# Patient Record
Sex: Male | Born: 1970 | Race: Black or African American | Hispanic: No | Marital: Married | Smoking: Current every day smoker
Health system: Southern US, Community
[De-identification: ages and names within clinical notes are randomized; demographics above are authoritative.]

## PROBLEM LIST (undated history)

## (undated) DIAGNOSIS — M549 Dorsalgia, unspecified: Secondary | ICD-10-CM

## (undated) DIAGNOSIS — I10 Essential (primary) hypertension: Secondary | ICD-10-CM

## (undated) DIAGNOSIS — T300 Burn of unspecified body region, unspecified degree: Secondary | ICD-10-CM

## (undated) HISTORY — PX: TOOTH EXTRACTION: SUR596

---

## 1998-04-27 ENCOUNTER — Emergency Department (HOSPITAL_COMMUNITY): Admission: EM | Admit: 1998-04-27 | Discharge: 1998-04-27 | Payer: Self-pay | Admitting: Emergency Medicine

## 1999-05-21 ENCOUNTER — Emergency Department (HOSPITAL_COMMUNITY): Admission: EM | Admit: 1999-05-21 | Discharge: 1999-05-21 | Payer: Self-pay | Admitting: Emergency Medicine

## 1999-09-16 ENCOUNTER — Emergency Department (HOSPITAL_COMMUNITY): Admission: EM | Admit: 1999-09-16 | Discharge: 1999-09-16 | Payer: Self-pay | Admitting: Emergency Medicine

## 2001-06-24 ENCOUNTER — Emergency Department (HOSPITAL_COMMUNITY): Admission: EM | Admit: 2001-06-24 | Discharge: 2001-06-24 | Payer: Self-pay | Admitting: Emergency Medicine

## 2001-06-24 ENCOUNTER — Encounter: Payer: Self-pay | Admitting: Emergency Medicine

## 2002-01-18 ENCOUNTER — Emergency Department (HOSPITAL_COMMUNITY): Admission: EM | Admit: 2002-01-18 | Discharge: 2002-01-18 | Payer: Self-pay | Admitting: Emergency Medicine

## 2002-11-25 ENCOUNTER — Emergency Department (HOSPITAL_COMMUNITY): Admission: EM | Admit: 2002-11-25 | Discharge: 2002-11-26 | Payer: Self-pay | Admitting: Emergency Medicine

## 2008-07-15 ENCOUNTER — Emergency Department (HOSPITAL_COMMUNITY): Admission: EM | Admit: 2008-07-15 | Discharge: 2008-07-15 | Payer: Self-pay | Admitting: Family Medicine

## 2008-12-24 ENCOUNTER — Emergency Department (HOSPITAL_COMMUNITY): Admission: EM | Admit: 2008-12-24 | Discharge: 2008-12-24 | Payer: Self-pay | Admitting: Family Medicine

## 2009-04-11 ENCOUNTER — Emergency Department (HOSPITAL_COMMUNITY): Admission: EM | Admit: 2009-04-11 | Discharge: 2009-04-11 | Payer: Self-pay | Admitting: Emergency Medicine

## 2009-05-20 ENCOUNTER — Emergency Department (HOSPITAL_COMMUNITY): Admission: EM | Admit: 2009-05-20 | Discharge: 2009-05-21 | Payer: Self-pay | Admitting: Emergency Medicine

## 2011-03-26 ENCOUNTER — Inpatient Hospital Stay (INDEPENDENT_AMBULATORY_CARE_PROVIDER_SITE_OTHER)
Admission: RE | Admit: 2011-03-26 | Discharge: 2011-03-26 | Disposition: A | Discharge status: Home / Self Care | Payer: Self-pay | Source: Ambulatory Visit | Attending: Family Medicine | Admitting: Family Medicine

## 2011-03-26 DIAGNOSIS — K089 Disorder of teeth and supporting structures, unspecified: Secondary | ICD-10-CM

## 2011-03-26 DIAGNOSIS — K029 Dental caries, unspecified: Secondary | ICD-10-CM

## 2011-03-26 LAB — GLUCOSE, CAPILLARY: Glucose-Capillary: 88 mg/dL (ref 70–99)

## 2011-08-31 ENCOUNTER — Encounter (HOSPITAL_COMMUNITY): Payer: Self-pay

## 2011-08-31 ENCOUNTER — Emergency Department (INDEPENDENT_AMBULATORY_CARE_PROVIDER_SITE_OTHER)
Admission: EM | Admit: 2011-08-31 | Discharge: 2011-08-31 | Disposition: A | Discharge status: Home / Self Care | Payer: Self-pay | Source: Home / Self Care | Attending: Emergency Medicine | Admitting: Emergency Medicine

## 2011-08-31 DIAGNOSIS — K047 Periapical abscess without sinus: Secondary | ICD-10-CM

## 2011-08-31 DIAGNOSIS — IMO0002 Reserved for concepts with insufficient information to code with codable children: Secondary | ICD-10-CM

## 2011-08-31 HISTORY — DX: Dorsalgia, unspecified: M54.9

## 2011-08-31 MED ORDER — HYDROCODONE-ACETAMINOPHEN 5-325 MG PO TABS: ORAL_TABLET | ORAL | Status: AC

## 2011-08-31 MED ORDER — KETOROLAC TROMETHAMINE 60 MG/2ML IM SOLN
60.0000 mg | Freq: Once | INTRAMUSCULAR | Status: AC
  Administered 2011-08-31: 60 mg via INTRAMUSCULAR

## 2011-08-31 MED ORDER — PENICILLIN V POTASSIUM 500 MG PO TABS: 500.0000 mg | ORAL_TABLET | Freq: Four times a day (QID) | ORAL | Status: AC

## 2011-08-31 MED ORDER — KETOROLAC TROMETHAMINE 60 MG/2ML IM SOLN
INTRAMUSCULAR | Status: AC
  Filled 2011-08-31: qty 2

## 2011-08-31 MED ORDER — CYCLOBENZAPRINE HCL 5 MG PO TABS: 5.0000 mg | ORAL_TABLET | Freq: Three times a day (TID) | ORAL | Status: AC | PRN

## 2011-08-31 MED ORDER — PREDNISONE 10 MG PO TABS: ORAL_TABLET | ORAL | Status: DC

## 2011-08-31 NOTE — ED Notes (Signed)
States he has hx of low back pain, states flared up approx 3 days ago- no injury or strenuous activity.  Also c/o toothache (rt lower) that is making rt ear and face hurt - states this has been off and on for a while.  Has not taken anything for the pain.

## 2011-08-31 NOTE — ED Provider Notes (Signed)
History     CSN: [id number]  Arrival date & time 08/31/11  1127   First MD Initiated Contact with Patient 08/31/11 1143      Chief Complaint  Patient presents with  . Back Pain  . Dental Pain    (Consider location/radiation/quality/duration/timing/severity/associated sxs/prior treatment) HPI Comments: Daniel Rivera is in today for lower back pain and toothache.  His back pain has been going on since 1996. He attributes this to lifting heavy weights. He was diagnosed with a spinal nerve root irritation. He saw an orthopedist about 6-7 years ago. An MRI was done and he was given epidural steroids but they did not do any good. Since then he's had daily lower back pain with radiation to both thighs and numbness and tingling in both thighs. It's gotten worse over the past 4 days. He denies any precipitating factors. He denies any muscle weakness, bladder, or bowel complaints. The pain is worse with bending and lifting and better with rest.  He also has had a three-day history of pain in his right lower second molar tooth. This is partially decayed. He has multiple decayed teeth but this is only one is bothering him. It's painful. There is no swelling or. No drainage. It hurts with cool air, radiates towards the ear, and is associated with some headache. He denies fever or chills. It hurts to chew on that side, he denies any trouble breathing or swallowing.  Patient is a 41 y.o. male presenting with back pain and tooth pain.  Back Pain  Associated symptoms include numbness. Pertinent negatives include no fever, no abdominal pain, no dysuria and no weakness.  Dental PainPrimary symptoms do not include fever, shortness of breath or sore throat.  Additional symptoms do not include: facial swelling and trouble swallowing.    Past Medical History  Diagnosis Date  . Back pain     History reviewed. No pertinent past surgical history.  History reviewed. No pertinent family history.  History    Substance Use Topics  . Smoking status: Current Everyday Smoker  . Smokeless tobacco: Not on file  . Alcohol Use: Yes      Review of Systems  Constitutional: Negative for fever, chills and unexpected weight change.  HENT: Positive for dental problem. Negative for sore throat, facial swelling, trouble swallowing, neck pain and voice change.   Respiratory: Negative for chest tightness and shortness of breath.   Gastrointestinal: Negative for abdominal pain.  Genitourinary: Negative for dysuria, urgency, frequency and difficulty urinating.  Musculoskeletal: Positive for back pain. Negative for myalgias, joint swelling, arthralgias and gait problem.  Neurological: Positive for numbness. Negative for weakness.    Allergies  Review of patient's allergies indicates no known allergies.  Home Medications   Current Outpatient Rx  Name Route Sig Dispense Refill  . CYCLOBENZAPRINE HCL 5 MG PO TABS Oral Take 1 tablet (5 mg total) by mouth 3 (three) times daily as needed for muscle spasms. 30 tablet 0  . HYDROCODONE-ACETAMINOPHEN 5-325 MG PO TABS  1 to 2 tabs every 4 to 6 hours as needed for pain. 20 tablet 0  . PENICILLIN V POTASSIUM 500 MG PO TABS Oral Take 1 tablet (500 mg total) by mouth 4 (four) times daily. 40 tablet 0  . PREDNISONE 10 MG PO TABS  Take 4 tabs daily for 4 days, 3 tabs daily for 4 days, 2 tabs daily for 4 days, then 1 tab daily for 4 days.  Take all tabs at one time with food  and preferably in the morning except for the first dose. 40 tablet 0    BP 152/94  Pulse 72  Temp(Src) 97.9 F (36.6 C) (Oral)  Resp 20  SpO2 99%  Physical Exam  Nursing note and vitals reviewed. Constitutional: He is oriented to person, place, and time. He appears well-developed and well-nourished. No distress.  HENT:  Head: Normocephalic and atraumatic. No trismus in the jaw.  Right Ear: External ear normal.  Left Ear: External ear normal.  Nose: Nose normal.  Mouth/Throat: Uvula is  midline and oropharynx is clear and moist. Mucous membranes are not pale and not dry. No oral lesions. Abnormal dentition. Dental abscesses and dental caries present. No uvula swelling. No oropharyngeal exudate, posterior oropharyngeal edema or posterior oropharyngeal erythema.       His right lower second molar tooth is partially decayed and painful to touch. There is no purulent drainage or summation or swelling of the surrounding gingiva. No swelling of the floor the mouth. No swelling externally, but the jaw is tender to touch.  Eyes: Conjunctivae and EOM are normal. Pupils are equal, round, and reactive to light.  Neck: Normal range of motion. Neck supple.  Cardiovascular: Normal rate, regular rhythm and normal heart sounds.   Pulmonary/Chest: Effort normal and breath sounds normal. No respiratory distress.  Abdominal: Soft. Bowel sounds are normal. He exhibits no distension, no abdominal bruit, no pulsatile midline mass and no mass. There is no tenderness. There is no rebound and no guarding.       No pulsatile midline abdominal mass or bruit.  Musculoskeletal: He exhibits tenderness. He exhibits no edema.       Lumbar back: He exhibits decreased range of motion, tenderness, bony tenderness and pain. He exhibits no swelling, no edema, no deformity, no spasm and normal pulse.       Exam of the back reveals midline tenderness to palpation. The back has a surprisingly good range of motion but with pain. Straight leg raising is positive bilaterally. DTRs are 1+ and symmetrical for both knees and ankles. Muscle strength and sensation are intact. Pedal pulses are full.  Lymphadenopathy:    He has no cervical adenopathy.  Neurological: He is alert and oriented to person, place, and time. He has normal reflexes. He displays no atrophy. No sensory deficit. He exhibits normal muscle tone. Coordination and gait normal.  Skin: Skin is warm and dry. No rash noted. He is not diaphoretic.    ED Course    Procedures (including critical care time)  Labs Reviewed - No data to display No results found.   1. Dental abscess   2. Degenerative disc disease       MDM          Roque Lias, MD 08/31/11 1454

## 2011-10-01 ENCOUNTER — Ambulatory Visit: Payer: Self-pay | Admitting: Family Medicine

## 2013-02-18 ENCOUNTER — Encounter (HOSPITAL_COMMUNITY): Payer: Self-pay

## 2013-02-18 ENCOUNTER — Emergency Department (HOSPITAL_COMMUNITY)
Admission: EM | Admit: 2013-02-18 | Discharge: 2013-02-18 | Disposition: A | Discharge status: Home / Self Care | Payer: Self-pay | Attending: Emergency Medicine | Admitting: Emergency Medicine

## 2013-02-18 DIAGNOSIS — S0180XA Unspecified open wound of other part of head, initial encounter: Secondary | ICD-10-CM | POA: Insufficient documentation

## 2013-02-18 DIAGNOSIS — Y929 Unspecified place or not applicable: Secondary | ICD-10-CM | POA: Insufficient documentation

## 2013-02-18 DIAGNOSIS — W260XXA Contact with knife, initial encounter: Secondary | ICD-10-CM | POA: Insufficient documentation

## 2013-02-18 DIAGNOSIS — F17200 Nicotine dependence, unspecified, uncomplicated: Secondary | ICD-10-CM | POA: Insufficient documentation

## 2013-02-18 DIAGNOSIS — S0181XA Laceration without foreign body of other part of head, initial encounter: Secondary | ICD-10-CM

## 2013-02-18 DIAGNOSIS — Y9389 Activity, other specified: Secondary | ICD-10-CM | POA: Insufficient documentation

## 2013-02-18 MED ORDER — TETANUS-DIPHTH-ACELL PERTUSSIS 5-2.5-18.5 LF-MCG/0.5 IM SUSP
0.5000 mL | Freq: Once | INTRAMUSCULAR | Status: DC
  Filled 2013-02-18: qty 0.5

## 2013-02-18 MED ORDER — HYDROCODONE-ACETAMINOPHEN 5-325 MG PO TABS
2.0000 | ORAL_TABLET | Freq: Once | ORAL | Status: DC
  Filled 2013-02-18: qty 2

## 2013-02-18 MED ORDER — HYDROCODONE-ACETAMINOPHEN 5-325 MG PO TABS: 1.0000 | ORAL_TABLET | ORAL | Status: DC | PRN

## 2013-02-18 NOTE — ED Notes (Signed)
MD at bedside. 

## 2013-02-18 NOTE — ED Notes (Signed)
Pt not in room Charge Candise Bowens made aware
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
## 2013-02-18 NOTE — ED Notes (Addendum)
Laceration to chin EDPA heather in room

## 2013-02-18 NOTE — ED Notes (Signed)
Pt not in room.   Pt did not receive pain med RX. Pt did not receive meds ordered by EDPA at 2000.  EDPA Heather made aware.  Went by room twice and searched traige area for this pt and family.  Pt aware of meds and discharge papers.

## 2013-02-19 NOTE — ED Provider Notes (Signed)
  Medical screening examination/treatment/procedure(s) were performed by non-physician practitioner and as supervising physician I was immediately available for consultation/collaboration.    Gerhard Munch, MD 02/19/13 [phone]

## 2013-02-20 ENCOUNTER — Emergency Department (HOSPITAL_COMMUNITY)
Admission: EM | Admit: 2013-02-20 | Discharge: 2013-02-20 | Disposition: A | Discharge status: Home / Self Care | Payer: Self-pay | Attending: Emergency Medicine | Admitting: Emergency Medicine

## 2013-02-20 ENCOUNTER — Encounter (HOSPITAL_COMMUNITY): Payer: Self-pay | Admitting: Emergency Medicine

## 2013-02-20 DIAGNOSIS — Z760 Encounter for issue of repeat prescription: Secondary | ICD-10-CM | POA: Insufficient documentation

## 2013-02-20 DIAGNOSIS — M25519 Pain in unspecified shoulder: Secondary | ICD-10-CM | POA: Insufficient documentation

## 2013-02-20 DIAGNOSIS — M25511 Pain in right shoulder: Secondary | ICD-10-CM

## 2013-02-20 DIAGNOSIS — F17200 Nicotine dependence, unspecified, uncomplicated: Secondary | ICD-10-CM | POA: Insufficient documentation

## 2013-02-20 DIAGNOSIS — R51 Headache: Secondary | ICD-10-CM | POA: Insufficient documentation

## 2013-02-20 DIAGNOSIS — G8911 Acute pain due to trauma: Secondary | ICD-10-CM | POA: Insufficient documentation

## 2013-02-20 MED ORDER — HYDROCODONE-ACETAMINOPHEN 5-325 MG PO TABS: 1.0000 | ORAL_TABLET | ORAL | Status: DC | PRN

## 2013-02-20 MED ORDER — HYDROCODONE-ACETAMINOPHEN 5-325 MG PO TABS
1.0000 | ORAL_TABLET | Freq: Once | ORAL | Status: AC
  Administered 2013-02-20: 1 via ORAL
  Filled 2013-02-20: qty 1
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
## 2013-02-20 NOTE — Progress Notes (Signed)
[facility] has seen patient and provided him with a list of primary care resources. °
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
## 2013-02-20 NOTE — ED Notes (Signed)
PA at bedside.

## 2013-02-21 NOTE — ED Provider Notes (Signed)
Medical screening examination/treatment/procedure(s) were performed by non-physician practitioner and as supervising physician I was immediately available for consultation/collaboration.  Candyce Churn, MD 02/21/13 [phone]

## 2013-06-14 ENCOUNTER — Encounter (HOSPITAL_COMMUNITY): Payer: Self-pay | Admitting: Emergency Medicine

## 2013-06-14 ENCOUNTER — Emergency Department (HOSPITAL_COMMUNITY)
Admission: EM | Admit: 2013-06-14 | Discharge: 2013-06-14 | Disposition: A | Discharge status: Home / Self Care | Payer: Self-pay | Attending: Emergency Medicine | Admitting: Emergency Medicine

## 2013-06-14 DIAGNOSIS — F17200 Nicotine dependence, unspecified, uncomplicated: Secondary | ICD-10-CM | POA: Insufficient documentation

## 2013-06-14 DIAGNOSIS — K029 Dental caries, unspecified: Secondary | ICD-10-CM | POA: Insufficient documentation

## 2013-06-14 DIAGNOSIS — K047 Periapical abscess without sinus: Secondary | ICD-10-CM | POA: Insufficient documentation

## 2013-06-14 MED ORDER — PENICILLIN V POTASSIUM 250 MG PO TABS: 250.0000 mg | ORAL_TABLET | Freq: Four times a day (QID) | ORAL | Status: AC

## 2013-06-14 MED ORDER — HYDROCODONE-ACETAMINOPHEN 5-325 MG PO TABS: 1.0000 | ORAL_TABLET | Freq: Four times a day (QID) | ORAL | Status: DC | PRN

## 2013-06-14 NOTE — ED Provider Notes (Addendum)
CSN: [id number]     Arrival date & time 06/14/13  7829 History   First MD Initiated Contact with Patient 06/14/13 0749     Chief Complaint  Patient presents with  . Dental Pain   (Consider location/radiation/quality/duration/timing/severity/associated sxs/prior Treatment) Patient is a 42 y.o. male presenting with tooth pain. The history is provided by the patient.  Dental Pain Location:  Lower Lower teeth location:  19/LL 1st molar Quality:  Localized, sharp, pulsating and throbbing Severity:  Severe Onset quality:  Gradual Duration:  4 days Timing:  Constant Progression:  Worsening Chronicity:  New Context: abscess and dental caries   Context: not trauma   Previous work-up:  Filled cavity Relieved by:  None tried Worsened by:  Cold food/drink, hot food/drink, jaw movement and touching Ineffective treatments:  None tried Associated symptoms: facial pain and facial swelling   Associated symptoms: no difficulty swallowing, no drooling, no neck swelling and no trismus   Risk factors: alcohol problem, lack of dental care, periodontal disease and smoking     Past Medical History  Diagnosis Date  . Back pain    No past surgical history on file. No family history on file. History  Substance Use Topics  . Smoking status: Current Every Day Smoker  . Smokeless tobacco: Not on file  . Alcohol Use: Yes    Review of Systems  HENT: Positive for facial swelling. Negative for drooling.   All other systems reviewed and are negative.    Allergies  Review of patient's allergies indicates no known allergies.  Home Medications   Current Outpatient Rx  Name  Route  Sig  Dispense  Refill  . HYDROcodone-acetaminophen (NORCO/VICODIN) 5-325 MG per tablet   Oral   Take 1 tablet by mouth every 4 (four) hours as needed for pain.   6 tablet   0    BP 123/91  Pulse 93  Temp(Src) 98.8 F (37.1 C) (Oral)  Resp 20  SpO2 97% Physical Exam  Nursing note and vitals  reviewed. Constitutional: He is oriented to person, place, and time. He appears well-developed and well-nourished. No distress.  HENT:  Head: Normocephalic and atraumatic.  Mouth/Throat: No trismus in the jaw. Dental abscesses and dental caries present.    Mild looseness of the tooth.  No subungal swelling or tongue swelling  Eyes: EOM are normal. Pupils are equal, round, and reactive to light.  Cardiovascular: Normal rate.   Pulmonary/Chest: Effort normal.  Musculoskeletal: He exhibits no edema and no tenderness.  Neurological: He is alert and oriented to person, place, and time.  Skin: Skin is warm and dry. No rash noted. No erythema.  Psychiatric: He has a normal mood and affect. His behavior is normal.    ED Course  Procedures (including critical care time) Labs Review Labs Reviewed - No data to display Imaging Review No results found.  EKG Interpretation   None       MDM   1. Dental abscess     Pt with dental caries and facial swelling.  No signs of ludwig's angina or difficulty swallowing and no systemic symptoms. Will treat with PCN and have pt f/u with dentist.    Gwyneth Sprout, MD 06/14/13 0800  Gwyneth Sprout, MD 06/14/13 [phone]

## 2013-06-14 NOTE — ED Notes (Signed)
Patient states three days ago tooth pain started. Patient states the tooth is a "Bad tooth." Patient states, "Tried to take pain as long as he could."

## 2013-06-14 NOTE — ED Notes (Signed)
MD at bedside. 

## 2013-07-31 ENCOUNTER — Emergency Department (HOSPITAL_COMMUNITY)
Admission: EM | Admit: 2013-07-31 | Discharge: 2013-07-31 | Disposition: A | Discharge status: Home / Self Care | Payer: Self-pay | Attending: Emergency Medicine | Admitting: Emergency Medicine

## 2013-07-31 DIAGNOSIS — F17200 Nicotine dependence, unspecified, uncomplicated: Secondary | ICD-10-CM | POA: Insufficient documentation

## 2013-07-31 DIAGNOSIS — K089 Disorder of teeth and supporting structures, unspecified: Secondary | ICD-10-CM | POA: Insufficient documentation

## 2013-07-31 DIAGNOSIS — K002 Abnormalities of size and form of teeth: Secondary | ICD-10-CM | POA: Insufficient documentation

## 2013-07-31 DIAGNOSIS — Z8739 Personal history of other diseases of the musculoskeletal system and connective tissue: Secondary | ICD-10-CM | POA: Insufficient documentation

## 2013-07-31 DIAGNOSIS — K047 Periapical abscess without sinus: Secondary | ICD-10-CM | POA: Insufficient documentation

## 2013-07-31 DIAGNOSIS — K029 Dental caries, unspecified: Secondary | ICD-10-CM | POA: Insufficient documentation

## 2013-07-31 MED ORDER — PENICILLIN V POTASSIUM 500 MG PO TABS: 500.0000 mg | ORAL_TABLET | Freq: Four times a day (QID) | ORAL | Status: DC

## 2013-07-31 MED ORDER — TRAMADOL HCL 50 MG PO TABS
50.0000 mg | ORAL_TABLET | Freq: Once | ORAL | Status: AC
  Administered 2013-07-31: 50 mg via ORAL
  Filled 2013-07-31: qty 1

## 2013-07-31 MED ORDER — PENICILLIN V POTASSIUM 500 MG PO TABS
500.0000 mg | ORAL_TABLET | Freq: Once | ORAL | Status: AC
  Administered 2013-07-31: 500 mg via ORAL
  Filled 2013-07-31: qty 1

## 2013-07-31 MED ORDER — TRAMADOL HCL 50 MG PO TABS: 50.0000 mg | ORAL_TABLET | Freq: Four times a day (QID) | ORAL | Status: DC | PRN

## 2013-07-31 NOTE — ED Notes (Signed)
Pt c/o dental abscess to L side of mouth for 2 days. Pt states he does not have a dentist. Pt with no acute distress.

## 2013-07-31 NOTE — ED Provider Notes (Signed)
CSN: [id number]     Arrival date & time 07/31/13  2036 History  This chart was scribed for Earley Favor, NP, working with Shanna Cisco, MD by Blanchard Kelch, ED Scribe. This patient was seen in room WTR9/WTR9 and the patient's care was started at 9:46 PM.     Chief Complaint  Patient presents with  . Dental abscess    Patient is a 42 y.o. male presenting with tooth pain. The history is provided by the patient. No language interpreter was used.  Dental Pain Location:  Generalized and lower Lower teeth location:  19/LL 1st molar, 18/LL 2nd molar and 17/LL 3rd molar Quality:  Aching Severity:  Moderate Onset quality:  Gradual Duration:  2 days Timing:  Constant Progression:  Worsening Chronicity:  Recurrent Context: abscess, dental caries and poor dentition   Relieved by:  Nothing Worsened by:  Nothing tried Associated symptoms: facial swelling and gum swelling     HPI Comments: Leonides Minder is a 42 y.o. male who presents to the Emergency Department complaining of constant left sided dental pain that began two days ago. He has associated swelling on the left side. He has not been seen by a dentist for the pain.  He goes to [facility] walk in dental clinic.  Past Medical History  Diagnosis Date  . Back pain    No past surgical history on file. No family history on file. History  Substance Use Topics  . Smoking status: Current Every Day Smoker -- 1.50 packs/day  . Smokeless tobacco: Not on file  . Alcohol Use: No    Review of Systems  HENT: Positive for dental problem and facial swelling.   All other systems reviewed and are negative.    Allergies  Review of patient's allergies indicates no known allergies.  Home Medications   Current Outpatient Rx  Name  Route  Sig  Dispense  Refill  . penicillin v potassium (VEETID) 500 MG tablet   Oral   Take 1 tablet (500 mg total) by mouth 4 (four) times daily.   39 tablet   0   . traMADol (ULTRAM) 50 MG tablet  Oral   Take 1 tablet (50 mg total) by mouth every 6 (six) hours as needed.   30 tablet   0    Triage Vitals: BP 144/96  Pulse 95  Temp(Src) 98.6 F (37 C) (Oral)  Resp 18  SpO2 100%  Physical Exam  Nursing note and vitals reviewed. Constitutional: He is oriented to person, place, and time. He appears well-developed and well-nourished. No distress.  HENT:  Head: Normocephalic and atraumatic.  Right Ear: External ear normal.  Left Ear: External ear normal.  Mouth/Throat: Abnormal dentition.  Pyria in front six teeth. Missing tooth and then molars. Gums inflamed from 2nd 3rd and 4th molar on left side. Submandibular adenopathy  Eyes: EOM are normal. Pupils are equal, round, and reactive to light.  Neck: Neck supple. No tracheal deviation present.  Cardiovascular: Normal rate.   Pulmonary/Chest: Effort normal. No respiratory distress.  Musculoskeletal: Normal range of motion.  Lymphadenopathy:       Head (right side): Submandibular adenopathy present.       Head (left side): Submandibular adenopathy present.  Submandibular adenopathy.  Neurological: He is alert and oriented to person, place, and time.  Skin: Skin is warm and dry.  Psychiatric: He has a normal mood and affect. His behavior is normal.    ED Course  Procedures (including critical care time)  DIAGNOSTIC STUDIES: Oxygen Saturation is 100% on room air, normal by my interpretation.    COORDINATION OF CARE: 9:51 PM -Will provide materials to patient for dental care. Patient verbalizes understanding and agrees with treatment plan.     Labs Review Labs Reviewed - No data to display Imaging Review No results found.  EKG Interpretation   None       MDM   1. Dental abscess      I personally performed the services described in this documentation, which was scribed in my presence. The recorded information has been reviewed and is accurate.      Arman Filter, NP 07/31/13 2220

## 2013-08-01 NOTE — ED Provider Notes (Signed)
Medical screening examination/treatment/procedure(s) were performed by non-physician practitioner and as supervising physician I was immediately available for consultation/collaboration.  Shanna Cisco, MD 08/01/13 1106

## 2014-01-01 ENCOUNTER — Emergency Department (HOSPITAL_COMMUNITY)
Admission: EM | Admit: 2014-01-01 | Discharge: 2014-01-02 | Discharge status: Against Medical Advice | Payer: Self-pay | Attending: Emergency Medicine | Admitting: Emergency Medicine

## 2014-01-01 ENCOUNTER — Encounter (HOSPITAL_COMMUNITY): Payer: Self-pay | Admitting: Emergency Medicine

## 2014-01-01 DIAGNOSIS — F17200 Nicotine dependence, unspecified, uncomplicated: Secondary | ICD-10-CM | POA: Insufficient documentation

## 2014-01-01 DIAGNOSIS — K089 Disorder of teeth and supporting structures, unspecified: Secondary | ICD-10-CM | POA: Insufficient documentation

## 2014-01-01 NOTE — ED Notes (Signed)
Pt presents with c/o dental pain. Pt says that his front tooth is broken and loose and that it is very painful. Also c/o abscess to the left lower side of his mouth.

## 2014-01-02 NOTE — ED Notes (Signed)
Patient left after being triaged.  No staff notified

## 2014-01-14 ENCOUNTER — Encounter (HOSPITAL_COMMUNITY): Payer: Self-pay | Admitting: Emergency Medicine

## 2014-01-14 ENCOUNTER — Emergency Department (HOSPITAL_COMMUNITY)
Admission: EM | Admit: 2014-01-14 | Discharge: 2014-01-14 | Disposition: A | Discharge status: Home / Self Care | Payer: Self-pay | Attending: Emergency Medicine | Admitting: Emergency Medicine

## 2014-01-14 DIAGNOSIS — F17200 Nicotine dependence, unspecified, uncomplicated: Secondary | ICD-10-CM | POA: Insufficient documentation

## 2014-01-14 DIAGNOSIS — K047 Periapical abscess without sinus: Secondary | ICD-10-CM

## 2014-01-14 DIAGNOSIS — K029 Dental caries, unspecified: Secondary | ICD-10-CM | POA: Insufficient documentation

## 2014-01-14 DIAGNOSIS — K044 Acute apical periodontitis of pulpal origin: Secondary | ICD-10-CM | POA: Insufficient documentation

## 2014-01-14 MED ORDER — HYDROCODONE-ACETAMINOPHEN 5-325 MG PO TABS: 1.0000 | ORAL_TABLET | ORAL | Status: DC | PRN

## 2014-01-14 MED ORDER — PENICILLIN V POTASSIUM 500 MG PO TABS
500.0000 mg | ORAL_TABLET | Freq: Four times a day (QID) | ORAL | Status: AC
Start: 2014-01-14 — End: 2014-01-21

## 2014-01-14 NOTE — ED Provider Notes (Signed)
CSN: [id number]     Arrival date & time 01/14/14  0740 History   First MD Initiated Contact with Patient 01/14/14 0750     Chief Complaint  Patient presents with  . Dental Pain     (Consider location/radiation/quality/duration/timing/severity/associated sxs/prior Treatment) Patient is a 43 y.o. male presenting with tooth pain. The history is provided by the patient.  Dental Pain  He complains of swelling and pain, left lower jaw, 3 days, he does not have a dentist. He's had recurrent problems like this in the past. He denies fever, chills, nausea, vomiting, weakness, or dizziness. History over-the-counter analgesia, without relief of his discomfort. There are no other known modifying factors  Past Medical History  Diagnosis Date  . Back pain    History reviewed. No pertinent past surgical history. No family history on file. History  Substance Use Topics  . Smoking status: Current Every Day Smoker -- 1.50 packs/day  . Smokeless tobacco: Not on file  . Alcohol Use: No    Review of Systems  All other systems reviewed and are negative.     Allergies  Review of patient's allergies indicates no known allergies.  Home Medications   Prior to Admission medications   Medication Sig Start Date End Date Taking? Authorizing Provider  HYDROcodone-acetaminophen (NORCO) 5-325 MG per tablet Take 1 tablet by mouth every 4 (four) hours as needed. 01/14/14   Flint MelterElliott L Romana Deaton, MD  penicillin v potassium (VEETID) 500 MG tablet Take 1 tablet (500 mg total) by mouth 4 (four) times daily. 01/14/14 01/21/14  Flint MelterElliott L Sarah Baez, MD   BP 150/86  Pulse 83  Temp(Src) 98.4 F (36.9 C) (Oral)  Resp 16  SpO2 100% Physical Exam  Nursing note and vitals reviewed. Constitutional: He is oriented to person, place, and time. He appears well-developed and well-nourished.  HENT:  Head: Normocephalic and atraumatic.  Right Ear: External ear normal.  Left Ear: External ear normal.  Poor dentition with multiple  caries, and fractured teeth. Induration left lower gutter, adjacent to lateral incisors, without frank abscess or drainage. There is no trismus. There is mild, associated swelling beneath the left mandible. No adenopathy of anterior cervical chain.  Eyes: Conjunctivae and EOM are normal. Pupils are equal, round, and reactive to light.  Neck: Normal range of motion and phonation normal. Neck supple.  Cardiovascular: Normal rate, regular rhythm, normal heart sounds and intact distal pulses.   Pulmonary/Chest: Effort normal and breath sounds normal. He exhibits no bony tenderness.  Abdominal: Soft. There is no tenderness.  Musculoskeletal: Normal range of motion.  Neurological: He is alert and oriented to person, place, and time. No cranial nerve deficit or sensory deficit. He exhibits normal muscle tone. Coordination normal.  Skin: Skin is warm, dry and intact.  Psychiatric: He has a normal mood and affect. His behavior is normal. Judgment and thought content normal.    ED Course  Procedures (including critical care time) Labs Review Labs Reviewed - No data to display  Imaging Review No results found.   EKG Interpretation None      MDM   Final diagnoses:  Dental infection    Dental infection, without abscess. Poor dentition. He needs extensive dental repair. He is referred to the on-call dentist.  Nursing Notes Reviewed/ Care Coordinated Applicable Imaging Reviewed Interpretation of Laboratory Data incorporated into ED treatment  The patient appears reasonably screened and/or stabilized for discharge and I doubt any other medical condition or other [facility] requiring further screening, evaluation, or treatment  in the ED at this time prior to discharge.  Plan: Home Medications- Norco, PCN; Home Treatments- rest, Heat; return here if the recommended treatment, does not improve the symptoms; Recommended follow up- On-call Dentist, call today    Flint Melter, MD 01/14/14 [phone]

## 2014-01-14 NOTE — ED Notes (Signed)
Pt c/o left lower dental pain x 1 week. Pt states he has an abscess there as well.

## 2014-01-14 NOTE — Discharge Instructions (Signed)
Facial Infection You have an infection of your face. This requires special attention to help prevent serious problems. Infections in facial wounds can cause poor healing and scars. They can also spread to deeper tissues, especially around the eye. Wound and dental infections can lead to sinusitis, infection of the eye socket, and even meningitis. Permanent damage to the skin, eye, and nervous system may result if facial infections are not treated properly. With severe infections, hospital care for IV antibiotic injections may be needed if they don't respond to oral antibiotics. Antibiotics must be taken for the full course to insure the infection is eliminated. If the infection came from a bad tooth, it may have to be extracted when the infection is under control. Warm compresses may be applied to reduce skin irritation and remove drainage. You might need a tetanus shot now if:  You cannot remember when your last tetanus shot was.  You have never had a tetanus shot.  The object that caused your wound was dirty. If you need a tetanus shot, and you decide not to get one, there is a rare chance of getting tetanus. Sickness from tetanus can be serious. If you got a tetanus shot, your arm may swell, get red and warm to the touch at the shot site. This is common and not a problem. SEEK IMMEDIATE MEDICAL CARE IF:   You have increased swelling, redness, or trouble breathing.  You have a severe headache, dizziness, nausea, or vomiting.  You develop problems with your eyesight.  You have a fever. Document Released: 09/02/2004 Document Revised: 10/18/2011 Document Reviewed: 07/26/2005 [facility] Patient Information 2014 [place].  Dental Care and Dentist Visits Dental care supports good overall health. Regular dental visits can also help you avoid dental pain, bleeding, infection, and other more serious health problems in the future. It is important to keep the mouth healthy because diseases in the  teeth, gums, and other oral tissues can spread to other areas of the body. Some problems, such as diabetes, heart disease, and pre-term labor have been associated with poor oral health.  See your dentist every 6 months. If you experience emergency problems such as a toothache or broken tooth, go to the dentist right away. If you see your dentist regularly, you may catch problems early. It is easier to be treated for problems in the early stages.  WHAT TO EXPECT AT A DENTIST VISIT  Your dentist will look for many common oral health problems and recommend proper treatment. At your regular dental visit, you can expect:  Gentle cleaning of the teeth and gums. This includes scraping and polishing. This helps to remove the sticky substance around the teeth and gums (plaque). Plaque forms in the mouth shortly after eating. Over time, plaque hardens on the teeth as tartar. If tartar is not removed regularly, it can cause problems. Cleaning also helps remove stains.  Periodic X-rays. These pictures of the teeth and supporting bone will help your dentist assess the health of your teeth.  Periodic fluoride treatments. Fluoride is a natural mineral shown to help strengthen teeth. Fluoride treatmentinvolves applying a fluoride gel or varnish to the teeth. It is most commonly done in children.  Examination of the mouth, tongue, jaws, teeth, and gums to look for any oral health problems, such as:  Cavities (dental caries). This is decay on the tooth caused by plaque, sugar, and acid in the mouth. It is best to catch a cavity when it is small.  Inflammation of the  gums caused by plaque buildup (gingivitis).  Problems with the mouth or malformed or misaligned teeth.  Oral cancer or other diseases of the soft tissues or jaws. KEEP YOUR TEETH AND GUMS HEALTHY For healthy teeth and gums, follow these general guidelines as well as your dentist's specific advice:  Have your teeth professionally cleaned at the  dentist every 6 months.  Brush twice daily with a fluoride toothpaste.  Floss your teeth daily.  Ask your dentist if you need fluoride supplements, treatments, or fluoride toothpaste.  Eat a healthy diet. Reduce foods and drinks with added sugar.  Avoid smoking. TREATMENT FOR ORAL HEALTH PROBLEMS If you have oral health problems, treatment varies depending on the conditions present in your teeth and gums.  Your caregiver will most likely recommend good oral hygiene at each visit.  For cavities, gingivitis, or other oral health disease, your caregiver will perform a procedure to treat the problem. This is typically done at a separate appointment. Sometimes your caregiver will refer you to another dental specialist for specific tooth problems or for surgery. SEEK IMMEDIATE DENTAL CARE IF:  You have pain, bleeding, or soreness in the gum, tooth, jaw, or mouth area.  A permanent tooth becomes loose or separated from the gum socket.  You experience a blow or injury to the mouth or jaw area. Document Released: 04/07/2011 Document Revised: 10/18/2011 Document Reviewed: 04/07/2011 [facility] Patient Information 2014 [place], [place].

## 2014-01-24 ENCOUNTER — Encounter (HOSPITAL_COMMUNITY): Payer: Self-pay | Admitting: Emergency Medicine

## 2014-01-24 ENCOUNTER — Emergency Department (HOSPITAL_COMMUNITY)
Admission: EM | Admit: 2014-01-24 | Discharge: 2014-01-25 | Disposition: A | Discharge status: Home / Self Care | Payer: Self-pay | Attending: Emergency Medicine | Admitting: Emergency Medicine

## 2014-01-24 DIAGNOSIS — K047 Periapical abscess without sinus: Secondary | ICD-10-CM | POA: Insufficient documentation

## 2014-01-24 DIAGNOSIS — Z792 Long term (current) use of antibiotics: Secondary | ICD-10-CM | POA: Insufficient documentation

## 2014-01-24 DIAGNOSIS — Z9889 Other specified postprocedural states: Secondary | ICD-10-CM | POA: Insufficient documentation

## 2014-01-24 DIAGNOSIS — K089 Disorder of teeth and supporting structures, unspecified: Secondary | ICD-10-CM | POA: Insufficient documentation

## 2014-01-24 DIAGNOSIS — F17200 Nicotine dependence, unspecified, uncomplicated: Secondary | ICD-10-CM | POA: Insufficient documentation

## 2014-01-24 DIAGNOSIS — K029 Dental caries, unspecified: Secondary | ICD-10-CM | POA: Insufficient documentation

## 2014-01-24 DIAGNOSIS — K0889 Other specified disorders of teeth and supporting structures: Secondary | ICD-10-CM

## 2014-01-24 MED ORDER — AMOXICILLIN 500 MG PO CAPS: 500.0000 mg | ORAL_CAPSULE | Freq: Three times a day (TID) | ORAL | Status: DC

## 2014-01-24 MED ORDER — OXYCODONE-ACETAMINOPHEN 5-325 MG PO TABS: 1.0000 | ORAL_TABLET | ORAL | Status: DC | PRN

## 2014-01-24 MED ORDER — HYDROMORPHONE HCL PF 2 MG/ML IJ SOLN
2.0000 mg | Freq: Once | INTRAMUSCULAR | Status: AC
  Administered 2014-01-24: 2 mg via INTRAMUSCULAR
  Filled 2014-01-24: qty 1

## 2014-01-24 MED ORDER — AMOXICILLIN 500 MG PO CAPS
1000.0000 mg | ORAL_CAPSULE | Freq: Three times a day (TID) | ORAL | Status: DC
  Administered 2014-01-24: 1000 mg via ORAL
  Filled 2014-01-24: qty 2

## 2014-01-24 NOTE — ED Provider Notes (Signed)
CSN: [id number]     Arrival date & time 01/24/14  2102 History   First MD Initiated Contact with Patient 01/24/14 2241     Chief Complaint  Patient presents with  . Oral Swelling     (Consider location/radiation/quality/duration/timing/severity/associated sxs/prior Treatment) Patient is a 43 y.o. male presenting with tooth pain.  Dental Pain Location:  Lower Lower teeth location:  18/LL 2nd molar and 19/LL 1st molar Quality:  Pressure-like, pulsating, radiating, constant and throbbing Severity:  Severe Onset quality:  Gradual Duration:  2 days Timing:  Constant Progression:  Worsening Chronicity:  Recurrent Context: dental caries   Relieved by:  None tried Worsened by:  Cold food/drink, touching, jaw movement, pressure and hot food/drink Ineffective treatments:  None tried Associated symptoms: facial swelling and gum swelling   Associated symptoms: no congestion, no difficulty swallowing, no drooling, no fever, no headaches, no neck pain, no neck swelling, no oral bleeding, no oral lesions and no trismus   Risk factors: periodontal disease and smoking      Past Medical History  Diagnosis Date  . Back pain    Past Surgical History  Procedure Laterality Date  . Tooth extraction     History reviewed. No pertinent family history. History  Substance Use Topics  . Smoking status: Current Every Day Smoker -- 1.50 packs/day  . Smokeless tobacco: Not on file  . Alcohol Use: Yes    Review of Systems  Constitutional: Negative for fever and chills.  HENT: Positive for facial swelling. Negative for congestion, drooling, mouth sores, trouble swallowing and voice change.   Gastrointestinal: Negative for nausea and vomiting.  Musculoskeletal: Negative for myalgias, neck pain and neck stiffness.  Skin: Negative for wound.  Neurological: Negative for headaches.      Allergies  Review of patient's allergies indicates no known allergies.  Home Medications   Prior to  Admission medications   Medication Sig Start Date End Date Taking? Authorizing Lavonda Thal  penicillin v potassium (VEETID) 500 MG tablet Take 500 mg by mouth 4 (four) times daily.   Yes Historical Bellina Tokarczyk, MD  HYDROcodone-acetaminophen (NORCO/VICODIN) 5-325 MG per tablet Take 1 tablet by mouth every 4 (four) hours as needed for moderate pain.    Historical Kylinn Shropshire, MD   BP 149/92  Pulse 84  Temp(Src) 98.7 F (37.1 C) (Oral)  Resp 18  SpO2 97% Physical Exam  Nursing note and vitals reviewed. Constitutional: He appears well-developed and well-nourished. No distress.  HENT:  Head: Normocephalic and atraumatic.  Mouth/Throat: Uvula is midline and oropharynx is clear and moist. No trismus in the jaw. Dental caries present. No uvula swelling.  Facial swelling present erythematous gums and induration present in the area of swelling without fluctuance. Foul smelling breath   Eyes: Conjunctivae are normal. No scleral icterus.  Neck: Normal range of motion. Neck supple.  Cardiovascular: Normal rate, regular rhythm and normal heart sounds.   Pulmonary/Chest: Effort normal and breath sounds normal. No respiratory distress.  Abdominal: Soft. There is no tenderness.  Musculoskeletal: He exhibits no edema.  Neurological: He is alert.  Skin: Skin is warm and dry. He is not diaphoretic.  Psychiatric: His behavior is normal.    ED Course  Procedures (including critical care time) Labs Review Labs Reviewed - No data to display  Imaging Review No results found.   EKG Interpretation None      MDM   Final diagnoses:  Pain, dental  Dental abscess    Patient with developing dental abscess.  He will  need pain meds and abx. Patient will follow up with his dentist.   Exam unconcerning for Ludwig's angina or spread of infection.  Will treat with [place] pain medicine.  Urged patient to follow-up with dentist.       Arthor CaptainAbigail Harris, PA-C 01/30/14 1057

## 2014-01-24 NOTE — ED Notes (Signed)
Patient is alert and oriented x3.  He is complaining of dental swelling that started 2 days ago. Patient states that he recently had 3 teeth pulled last week.  Currently he rates his pain 10 of 10.

## 2014-01-24 NOTE — Discharge Instructions (Signed)
You have been diagnosed with Dental pain. Please call the follow up dentist first thing in the morning on Monday for a follow up appointment. Keep your discharge paperwork from today's visit to bring to the dentist office. You may also use the resource guide listed below to help you find a dentist if you do not already have one to followup with. It is very important that you get evaluated by a dentist as soon as possible.  Use your pain medication as prescribed and do not operate heavy machinery while on pain medication. Note that your pain medication contains acetaminophen (Tylenol) & its is not reccommended that you use additional acetaminophen (Tylenol) while taking this medication. Take your full course of antibiotics. Read the instructions below. ° °Eat a soft or liquid diet and rinse your mouth out after meals with warm water. You should see a dentist or return here at once if you have increased swelling, increased pain or uncontrolled bleeding from the site of your injury. ° ° °SEEK MEDICAL CARE IF:  °· You have increased pain not controlled with medicines.  °· You have swelling around your tooth, in your face or neck.  °· You have bleeding which starts, continues, or gets worse.  °· You have a fever >101 °· If you are unable to open your mouth °Soft Diet  °The soft diet may be recommended after you were put on a full liquid diet. A normal diet may follow. The soft diet can also be used after surgery if you are too ill to keep down a normal diet. The soft diet may also be needed if you have a hard time chewing foods.  °DESCRIPTION  °Tender foods are used. Foods do not need to be ground or pureed. Most raw fruits and vegetables and coarse breads and cereals should be avoided. Fried foods and highly seasoned foods may cause discomfort.  °NUTRITIONAL ADEQUACY  °A healthy diet is possible if foods from each of the basic food groups are eaten daily.  °SOFT DIET FOOD LISTS  °Milk/Dairy  °Allowed: Milk and milk  drinks, milk shakes, cream cheese, cottage cheese, mild cheeses.  °Avoid: Sharp or highly seasoned cheese. °Meat/Meat Substitutes  °Allowed: Broiled, roasted, baked, or stewed tender lean beef, mutton, lamb, veal, chicken, turkey, liver, ham, crisp bacon, white fish, tuna, salmon. Eggs, smooth peanut butter.  °Avoid: All fried meats, fish, or fowl. Rich gravies and sauces. Lunch meats, sausages, hot dogs. Meats with gristle, chunky peanut butter. °Breads/Grains  °Allowed: Rice, noodles, spaghetti, macaroni. Dry or cooked refined cereals, such as farina, cream of wheat, oatmeal, grits, whole-wheat cereals. Plain or toasted white or wheat blend or whole-grain breads, soda crackers or saltines, flour tortillas.  °Avoid: Wild rice, coarse cereals, such as bran. Seed in or on breads and crackers. Bread or bread products with nuts or seeds. °Fruits/Vegetables  °Allowed: Fruit and vegetable juices, well-cooked or canned fruits and vegetables, any dried fruit. One citrus fruit daily, 1 vitamin A source daily. Well-ripened, easy to chew fruits, sweet potatoes. Baked, boiled, mashed, creamed, scalloped, or au gratin potatoes. Broths or creamed soups made with allowed vegetables, strained tomatoes.  °Avoid: All gas-forming vegetables (corn, radishes, Brussels sprouts, onions, broccoli, cabbage, parsnips, turnips, chili peppers, pinto beans, split peas, dried beans). Fruits containing seeds and skin. Potato chips and corn chips. All others that are not made with allowed vegetables. Highly seasoned soups. °Desserts/Sweets  °Allowed: Simple desserts, such as custard, junkets, gelatin desserts, plain ice cream and sherbets, simple cakes   and cookies, allowed fruits, sugar, syrup, jelly, honey, plain hard candy, and molasses.  Avoid: Rich pastries, any dessert containing dates, nuts, raisins, or coconut. Fried pastries, such as doughnuts. Chocolate. Beverages  Allowed: Fruit and vegetable juices. Caffeine-free carbonated drinks,  coffee, and tea.  Avoid: Caffeinated beverages: coffee, tea, soda or pop. Miscellaneous  Allowed: Butter, cream, margarine, mayonnaise, oil. Cream sauces, salt, and mild spices.  Avoid: Highly spiced salad dressings. Highly seasoned foods, hot sauce, mustard, horseradish, and pepper. SAMPLE MENU  Breakfast  Orange juice.  Oatmeal.  Soft cooked egg.  Toast and margarine.  2% milk.  Coffee. Lunch  Meatloaf.  Mashed potato.  Green beans.  Lemon pudding.  Bread and margarine.  Coffee. Dinner  Consomm or apricot nectar.  Chicken breast.  Rice, peas, and carrots.  Applesauce.  Bread and margarine.  2% milk. To cut the amount of fat in your diet, omit margarine and use 1% or skim milk.  NUTRIENT ANALYSIS  Calories........................1953 Kcal.  Protein.........................102 gm.  Carbohydrate...............247 gm.  Fat................................65 gm.  Cholesterol...................449 mg.  Dietary fiber.................19 gm.  Vitamin A.....................2944 RE.  Vitamin C.....................79 mg.  Niacin..........................25 mg.  Riboflavin....................2.0 mg.  Thiamin.......................1.5 mg.  Folate..........................249 mcg.  Calcium.......................1030 mg.  Phosphorus.................1782 mg.  Zinc..............................12 mg.  Iron..............................13 mg.  Sodium.........................299 mg.  Potassium....................3046 mg. Document Released: 11/02/2007 Document Revised: 10/18/2011 Document Reviewed: 11/02/2007  [facility] Patient Information 2014 [place].   Dental Abscess A dental abscess is a collection of infected fluid (pus) from a bacterial infection in the inner part of the tooth (pulp). It usually occurs at the end of the tooth's root.  CAUSES   Severe tooth decay.  Trauma to the tooth that allows bacteria to enter into the pulp, such as a broken or chipped  tooth. SYMPTOMS   Severe pain in and around the infected tooth.  Swelling and redness around the abscessed tooth or in the mouth or face.  Tenderness.  Pus drainage.  Bad breath.  Bitter taste in the mouth.  Difficulty swallowing.  Difficulty opening the mouth.  Nausea.  Vomiting.  Chills.  Swollen neck glands. DIAGNOSIS   A medical and dental history will be taken.  An examination will be performed by tapping on the abscessed tooth.  X-rays may be taken of the tooth to identify the abscess. TREATMENT The goal of treatment is to eliminate the infection. You may be prescribed antibiotic medicine to stop the infection from spreading. A root canal may be performed to save the tooth. If the tooth cannot be saved, it may be pulled (extracted) and the abscess may be drained.  HOME CARE INSTRUCTIONS  Only take over-the-counter or prescription medicines for pain, fever, or discomfort as directed by your caregiver.  Rinse your mouth (gargle) often with salt water ( tsp salt in 8 oz [250 ml] of warm water) to relieve pain or swelling.  Do not drive after taking pain medicine (narcotics).  Do not apply heat to the outside of your face.  Return to your dentist for further treatment as directed. SEEK MEDICAL CARE IF:  Your pain is not helped by medicine.  Your pain is getting worse instead of better. SEEK IMMEDIATE MEDICAL CARE IF:  You have a fever or persistent symptoms for more than 2-3 days.  You have a fever and your symptoms suddenly get worse.  You have chills or a very bad headache.  You have problems breathing or swallowing.  You have trouble opening your mouth.  You have swelling in the neck  or around the eye. Document Released: 07/26/2005 Document Revised: 04/19/2012 Document Reviewed: 11/03/2010 [facility] Patient Information 2015 [place], [place]. This information is not intended to replace advice given to you by your health care provider. Make sure you  discuss any questions you have with your health care provider.  RESOURCE GUIDE   Dental Problems  Dr. Grandville Silos $200 dollar visit [address] [place], [place] [address]  [phone]    Patients with Medicaid: [facility] [id number]                                           [phone] [address] [employer] Phone:  [phone]                                                  Phone:  [phone]  If unable to pay or uninsured, contact:  [facility] or [facility]. to become qualified for the adult dental clinic.  Chronic Pain Problems Contact [facility]  [phone] Patients need to be referred by their primary care doctor.  Insufficient Money for Medicine Contact United Way:  call "211" or [facility] [phone].  No Primary Care Doctor Call [facility]  [phone] Other agencies that provide inexpensive medical care    [facility]  [phone]    [facility]  [phone]    [facility]  [phone]    [facility]  [phone]    [facility]  [phone]    [facility]  [phone]  Psychological Services [facility]  [phone] [facility]  [phone] [facility]   [phone] (emergency services [phone])  Substance Abuse Resources [facility]  [phone] [facility] [phone] [facility] [phone] Floydene Flock [phone] Residential & Outpatient Substance Abuse Program  [phone]  Abuse/Neglect [facility] Child Abuse Hotline 6574496786 [facility] Child Abuse Hotline [phone] (After Hours)  Emergency Shelter [facility] 862-200-0416  M[facility] [place] [facility] [phone] [facility] 3067184427  MRSA Hotline #:   925 160 7688    [facility]  Resources  [facility]     [employer]                          [facility] Dept. [address] [place]                       [address]      [address] [place] [address]  Patrecia Pace  [facility] Phone:  [phone]                                   Phone:  [phone]                 Phone:  [phone]  [facility] Phone:  [phone]  [facility] Child Abuse Hotline 440-302-8657(336) [phone] 6513052305(336) [phone] (After Hours)

## 2014-02-06 NOTE — ED Provider Notes (Signed)
Medical screening examination/treatment/procedure(s) were performed by non-physician practitioner and as supervising physician I was immediately available for consultation/collaboration.   EKG Interpretation None       Ankit Nanavati, MD 02/06/14 0034 

## 2014-12-30 ENCOUNTER — Ambulatory Visit: Payer: Self-pay

## 2018-03-14 ENCOUNTER — Emergency Department (HOSPITAL_COMMUNITY)
Admission: EM | Admit: 2018-03-14 | Discharge: 2018-03-14 | Disposition: A | Discharge status: Home / Self Care | Payer: Self-pay | Attending: Emergency Medicine | Admitting: Emergency Medicine

## 2018-03-14 ENCOUNTER — Other Ambulatory Visit: Payer: Self-pay

## 2018-03-14 ENCOUNTER — Encounter (HOSPITAL_COMMUNITY): Payer: Self-pay

## 2018-03-14 DIAGNOSIS — L739 Follicular disorder, unspecified: Secondary | ICD-10-CM

## 2018-03-14 DIAGNOSIS — Z760 Encounter for issue of repeat prescription: Secondary | ICD-10-CM

## 2018-03-14 DIAGNOSIS — L663 Perifolliculitis capitis abscedens: Secondary | ICD-10-CM | POA: Insufficient documentation

## 2018-03-14 DIAGNOSIS — I10 Essential (primary) hypertension: Secondary | ICD-10-CM | POA: Insufficient documentation

## 2018-03-14 DIAGNOSIS — F17210 Nicotine dependence, cigarettes, uncomplicated: Secondary | ICD-10-CM | POA: Insufficient documentation

## 2018-03-14 HISTORY — DX: Burn of unspecified body region, unspecified degree: T30.0

## 2018-03-14 HISTORY — DX: Essential (primary) hypertension: I10

## 2018-03-14 MED ORDER — LISINOPRIL 10 MG PO TABS: 10.0000 mg | ORAL_TABLET | Freq: Every day | ORAL | 0 refills | Status: DC

## 2018-03-14 MED ORDER — DOXYCYCLINE HYCLATE 100 MG PO CAPS: 100.0000 mg | ORAL_CAPSULE | Freq: Two times a day (BID) | ORAL | 0 refills | Status: AC

## 2018-03-14 NOTE — Discharge Instructions (Addendum)
You may have diarrhea from the antibiotics.  It is very important that you continue to take the antibiotics even if you get diarrhea unless a medical professional tells you that you may stop taking them.  If you stop too early the bacteria you are being treated for will become stronger and you may need different, more powerful antibiotics that have more side effects and worsening diarrhea.  Please stay well hydrated and consider probiotics as they may decrease the severity of your diarrhea.    Doxycycline may make you more sensitive to the sun.  Please make sure that you are avoiding the sun or you will get sunburn.  I have re-ordered your home lisinopril.  Please establish primary care for additional refills.
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
## 2018-03-22 MED FILL — DOXYCYCLINE HYCLATE 100 MG: 100 | 7 days supply | Qty: 14 | Fill #0

## 2018-03-22 MED FILL — LISINOPRIL 10 MG TABS: 10 | 30 days supply | Qty: 30 | Fill #0

## 2018-03-29 ENCOUNTER — Ambulatory Visit: Payer: Self-pay | Admitting: Family Medicine

## 2019-09-16 ENCOUNTER — Emergency Department (HOSPITAL_COMMUNITY): Payer: Self-pay

## 2019-09-16 ENCOUNTER — Other Ambulatory Visit: Payer: Self-pay

## 2019-09-16 ENCOUNTER — Encounter (HOSPITAL_COMMUNITY): Payer: Self-pay | Admitting: Emergency Medicine

## 2019-09-16 ENCOUNTER — Emergency Department (HOSPITAL_COMMUNITY)
Admission: EM | Admit: 2019-09-16 | Discharge: 2019-09-17 | Disposition: A | Discharge status: Home / Self Care | Payer: Self-pay | Attending: Emergency Medicine | Admitting: Emergency Medicine

## 2019-09-16 DIAGNOSIS — U071 COVID-19: Secondary | ICD-10-CM | POA: Insufficient documentation

## 2019-09-16 DIAGNOSIS — F17210 Nicotine dependence, cigarettes, uncomplicated: Secondary | ICD-10-CM | POA: Insufficient documentation

## 2019-09-16 DIAGNOSIS — I10 Essential (primary) hypertension: Secondary | ICD-10-CM | POA: Insufficient documentation

## 2019-09-16 LAB — BASIC METABOLIC PANEL
Anion gap: 11 (ref 5–15)
BUN: 6 mg/dL (ref 6–20)
CO2: 24 mmol/L (ref 22–32)
Calcium: 8.3 mg/dL — ABNORMAL LOW (ref 8.9–10.3)
Chloride: 101 mmol/L (ref 98–111)
Creatinine, Ser: 1.02 mg/dL (ref 0.61–1.24)
GFR calc Af Amer: >60 mL/min (ref >60)
GFR calc non Af Amer: >60 mL/min (ref >60)
Glucose, Bld: 104 mg/dL — ABNORMAL HIGH (ref 70–99)
Potassium: 4 mmol/L (ref 3.5–5.1)
Sodium: 136 mmol/L (ref 135–145)

## 2019-09-16 LAB — CBC WITH DIFFERENTIAL/PLATELET
Abs Immature Granulocytes: 0.01 10*3/uL (ref 0.00–0.07)
Basophils Absolute: 0 10*3/uL (ref 0.0–0.1)
Basophils Relative: 0 %
Eosinophils Absolute: 0.1 10*3/uL (ref 0.0–0.5)
Eosinophils Relative: 3 %
HCT: 45.5 % (ref 39.0–52.0)
Hemoglobin: 14.6 g/dL (ref 13.0–17.0)
Immature Granulocytes: 0 %
Lymphocytes Relative: 50 %
Lymphs Abs: 1.4 10*3/uL (ref 0.7–4.0)
MCH: 31.1 pg (ref 26.0–34.0)
MCHC: 32.1 g/dL (ref 30.0–36.0)
MCV: 96.8 fL (ref 80.0–100.0)
Monocytes Absolute: 0.4 10*3/uL (ref 0.1–1.0)
Monocytes Relative: 12 %
Neutro Abs: 1 10*3/uL — ABNORMAL LOW (ref 1.7–7.7)
Neutrophils Relative %: 35 %
Platelets: 162 10*3/uL (ref 150–400)
RBC: 4.7 MIL/uL (ref 4.22–5.81)
RDW: 12.6 % (ref 11.5–15.5)
WBC: 2.9 10*3/uL — ABNORMAL LOW (ref 4.0–10.5)
nRBC: 0 % (ref 0.0–0.2)

## 2019-09-16 LAB — RESPIRATORY PANEL BY RT PCR (FLU A&B, COVID)
Influenza A by PCR: NEGATIVE
Influenza B by PCR: NEGATIVE
SARS Coronavirus 2 by RT PCR: POSITIVE — AB

## 2019-09-16 MED ORDER — KETOROLAC TROMETHAMINE 15 MG/ML IJ SOLN
15.0000 mg | Freq: Once | INTRAMUSCULAR | Status: AC
  Administered 2019-09-16: 15 mg via INTRAVENOUS
  Filled 2019-09-16: qty 1

## 2019-09-16 MED ORDER — LACTATED RINGERS IV BOLUS
1000.0000 mL | Freq: Once | INTRAVENOUS | Status: AC
  Administered 2019-09-16: 21:00:00 1000 mL via INTRAVENOUS

## 2019-09-16 NOTE — ED Triage Notes (Signed)
Patient here from home with complaints of fever and headache x2 days. Denies cough. Denies covid exposure.
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
## 2019-09-16 NOTE — ED Notes (Signed)
Date and time results received: 09/16/19 11:06 PM  (use smartphrase ".now" to insert current time)  Test: COVID Critical Value: Positive  Name of Provider Notified: Dr.Kohut  Orders Received? Or Actions Taken?:

## 2019-09-16 NOTE — ED Notes (Signed)
Pt ambulatory from triage with steady gait 

## 2020-12-16 ENCOUNTER — Encounter (HOSPITAL_COMMUNITY): Payer: Self-pay

## 2020-12-16 ENCOUNTER — Emergency Department (HOSPITAL_COMMUNITY)
Admission: EM | Admit: 2020-12-16 | Discharge: 2020-12-16 | Disposition: A | Discharge status: Home / Self Care | Payer: 59 | Attending: Emergency Medicine | Admitting: Emergency Medicine

## 2020-12-16 ENCOUNTER — Other Ambulatory Visit: Payer: Self-pay

## 2020-12-16 ENCOUNTER — Emergency Department (HOSPITAL_COMMUNITY): Payer: 59

## 2020-12-16 DIAGNOSIS — H6091 Unspecified otitis externa, right ear: Secondary | ICD-10-CM | POA: Diagnosis not present

## 2020-12-16 DIAGNOSIS — H60501 Unspecified acute noninfective otitis externa, right ear: Secondary | ICD-10-CM

## 2020-12-16 DIAGNOSIS — R220 Localized swelling, mass and lump, head: Secondary | ICD-10-CM | POA: Diagnosis present

## 2020-12-16 DIAGNOSIS — F17210 Nicotine dependence, cigarettes, uncomplicated: Secondary | ICD-10-CM | POA: Diagnosis not present

## 2020-12-16 DIAGNOSIS — Z79899 Other long term (current) drug therapy: Secondary | ICD-10-CM | POA: Diagnosis not present

## 2020-12-16 DIAGNOSIS — L03211 Cellulitis of face: Secondary | ICD-10-CM | POA: Insufficient documentation

## 2020-12-16 DIAGNOSIS — I10 Essential (primary) hypertension: Secondary | ICD-10-CM | POA: Diagnosis not present

## 2020-12-16 LAB — CBC WITH DIFFERENTIAL/PLATELET
Abs Immature Granulocytes: 0.01 10*3/uL (ref 0.00–0.07)
Basophils Absolute: 0 10*3/uL (ref 0.0–0.1)
Basophils Relative: 1 %
Eosinophils Absolute: 0.4 10*3/uL (ref 0.0–0.5)
Eosinophils Relative: 7 %
HCT: 41.1 % (ref 39.0–52.0)
Hemoglobin: 13.9 g/dL (ref 13.0–17.0)
Immature Granulocytes: 0 %
Lymphocytes Relative: 40 %
Lymphs Abs: 2.3 10*3/uL (ref 0.7–4.0)
MCH: 32.1 pg (ref 26.0–34.0)
MCHC: 33.8 g/dL (ref 30.0–36.0)
MCV: 94.9 fL (ref 80.0–100.0)
Monocytes Absolute: 0.6 10*3/uL (ref 0.1–1.0)
Monocytes Relative: 10 %
Neutro Abs: 2.5 10*3/uL (ref 1.7–7.7)
Neutrophils Relative %: 42 %
Platelets: 211 10*3/uL (ref 150–400)
RBC: 4.33 MIL/uL (ref 4.22–5.81)
RDW: 12.7 % (ref 11.5–15.5)
WBC: 5.7 10*3/uL (ref 4.0–10.5)
nRBC: 0 % (ref 0.0–0.2)

## 2020-12-16 LAB — BASIC METABOLIC PANEL
Anion gap: 5 (ref 5–15)
BUN: 15 mg/dL (ref 6–20)
CO2: 23 mmol/L (ref 22–32)
Calcium: 8.9 mg/dL (ref 8.9–10.3)
Chloride: 111 mmol/L (ref 98–111)
Creatinine, Ser: 0.87 mg/dL (ref 0.61–1.24)
GFR, Estimated: >60 mL/min (ref >60)
Glucose, Bld: 109 mg/dL — ABNORMAL HIGH (ref 70–99)
Potassium: 3.7 mmol/L (ref 3.5–5.1)
Sodium: 139 mmol/L (ref 135–145)

## 2020-12-16 MED ORDER — OXYCODONE HCL 5 MG PO TABS: 5.0000 mg | ORAL_TABLET | Freq: Four times a day (QID) | ORAL | 0 refills | Status: AC | PRN

## 2020-12-16 MED ORDER — SODIUM CHLORIDE 0.9 % IV SOLN
3.0000 g | Freq: Once | INTRAVENOUS | Status: AC
  Administered 2020-12-16: 3 g via INTRAVENOUS
  Filled 2020-12-16: qty 8

## 2020-12-16 MED ORDER — BACITRACIN ZINC 500 UNIT/GM EX OINT: 1.0000 "application " | TOPICAL_OINTMENT | Freq: Two times a day (BID) | CUTANEOUS | 0 refills | Status: AC

## 2020-12-16 MED ORDER — CIPROFLOXACIN-DEXAMETHASONE 0.3-0.1 % OT SUSP
4.0000 [drp] | Freq: Once | OTIC | Status: AC
  Administered 2020-12-16: 4 [drp] via OTIC
  Filled 2020-12-16: qty 7.5

## 2020-12-16 MED ORDER — DOXYCYCLINE HYCLATE 100 MG PO CAPS: 100.0000 mg | ORAL_CAPSULE | Freq: Two times a day (BID) | ORAL | 0 refills | Status: AC

## 2020-12-16 MED ORDER — NAPROXEN 500 MG PO TABS: 500.0000 mg | ORAL_TABLET | Freq: Two times a day (BID) | ORAL | 0 refills | Status: DC

## 2020-12-16 MED ORDER — IOHEXOL 300 MG/ML  SOLN
75.0000 mL | Freq: Once | INTRAMUSCULAR | Status: AC | PRN
  Administered 2020-12-16: 75 mL via INTRAVENOUS

## 2020-12-16 NOTE — ED Provider Notes (Signed)
[facility]-EMERGENCY DEPT Provider Note   CSN: [id number] Arrival date & time: 12/16/20  [id number]    History Chief Complaint  Patient presents with  . Ear Drainage    Daniel Rivera is a 50 y.o. male with no significant past medical history who presents for evaluation of right sided facial swelling. Began yesterday. Noted that his right ear is swollen and has drainage form the top portion of his ear. No drainage to coming from external ear canal. No ear pain. Does admit to diffuse pruritis to bilateral ears. Mild warmth to face. No fever, chills, N/V, CP, SOB, neck pain, paresthesias, weakness. Rates pain a 6/10. Has not taken anything for symptoms. No sore throat or difficulty swallowing. Does have his head and face. Denies any recent cuts. Initial thought drainage to right ear was blood. Denies any recent traumatic injuries. No associated HA, numbness, CN deficit. Denies additional aggravating or alleviating factors.  History obtained from patient and past medical records. No interpretor was used.  HPI     Past Medical History:  Diagnosis Date  . Back pain   . Burn   . Hypertension     There are no problems to display for this patient.   Past Surgical History:  Procedure Laterality Date  . TOOTH EXTRACTION         Family History  Problem Relation Age of Onset  . Diabetes Mother   . Diabetes Father     Social History   Tobacco Use  . Smoking status: Current Every Day Smoker    Packs/day: 1.50    Types: Cigarettes  . Smokeless tobacco: Never Used  Vaping Use  . Vaping Use: Never used  Substance Use Topics  . Alcohol use: Yes  . Drug use: Yes    Types: Marijuana    Home Medications Prior to Admission medications   Medication Sig Start Date End Date Taking? Authorizing Provider  bacitracin ointment Apply 1 application topically 2 (two) times daily. 12/16/20  Yes Jaise Moser A, PA-C  doxycycline (VIBRAMYCIN) 100 MG capsule Take 1 capsule  (100 mg total) by mouth 2 (two) times daily. 12/16/20  Yes Adamariz Gillott A, PA-C  naproxen (NAPROSYN) 500 MG tablet Take 1 tablet (500 mg total) by mouth 2 (two) times daily. 12/16/20  Yes Danialle Dement A, PA-C  oxyCODONE (ROXICODONE) 5 MG immediate release tablet Take 1 tablet (5 mg total) by mouth every 6 (six) hours as needed for up to 3 days for severe pain. 12/16/20 12/19/20 Yes Brett Darko A, PA-C  amoxicillin (AMOXIL) 500 MG capsule Take 1 capsule (500 mg total) by mouth 3 (three) times daily. Patient not taking: Reported on 03/14/2018 01/24/14   Arthor CaptainHarris, Abigail, PA-C  atorvastatin (LIPITOR) 40 MG tablet Take 40 mg by mouth daily.    [provider]  lisinopril (PRINIVIL,ZESTRIL) 10 MG tablet Take 1 tablet (10 mg total) by mouth daily. 03/14/18 04/13/18  Cristina GongHammond, Elizabeth W, PA-C  lisinopril (ZESTRIL) 20 MG tablet Take 20 mg by mouth daily.    [provider]  oxyCODONE-acetaminophen (PERCOCET) 5-325 MG per tablet Take 1-2 tablets by mouth every 4 (four) hours as needed. Patient not taking: Reported on 03/14/2018 01/24/14   Arthor CaptainHarris, Abigail, PA-C    Allergies    Patient has no known allergies.  Review of Systems   Review of Systems  Constitutional: Negative.   HENT: Positive for ear discharge, ear pain and facial swelling. Negative for congestion, dental problem, postnasal drip, rhinorrhea, sinus pressure,  sinus pain, sneezing, sore throat, tinnitus, trouble swallowing and voice change.   Respiratory: Negative.   Cardiovascular: Negative.   Gastrointestinal: Negative.   Genitourinary: Negative.   Musculoskeletal: Negative.   Skin: Negative.   Neurological: Negative.   All other systems reviewed and are negative.   Physical Exam Updated Vital Signs BP 131/66   Pulse 61   Temp 98.2 F (36.8 C)   Resp 18   Ht 5\' 11"  (1.803 m)   Wt 117.9 kg   SpO2 99%   BMI 36.26 kg/m   Physical Exam Vitals and nursing note reviewed.  Constitutional:      General: He is  not in acute distress.    Appearance: He is well-developed. He is not ill-appearing, toxic-appearing or diaphoretic.  HENT:     Head: Atraumatic.     Jaw: There is normal jaw occlusion.     Comments: Edema to right face starting at posterior ear, right ear into posterior mandible and into superior portion of right neck. Mild overlying warmth. No drooling, dysphagia, trismus.    Right Ear: No decreased hearing noted. Drainage, swelling and tenderness present. There is no impacted cerumen. No PE tube. No hemotympanum. Tympanic membrane is erythematous. Tympanic membrane is not injected, scarred, perforated, retracted or bulging.     Left Ear: Tympanic membrane, ear canal and external ear normal.     Ears:      Comments: Left TM clear. Moderate cerumen. Non tender. No external ear swelling.  Right TM clear. Mild canal edema WO drainage. Moderate external ear edema with drainage to antihelix.     Mouth/Throat:     Lips: Pink.     Mouth: Mucous membranes are moist.     Pharynx: Oropharynx is clear. Uvula midline.     Tonsils: No tonsillar exudate or tonsillar abscesses. 0 on the right. 0 on the left.     Comments: Posterior oropharynx clear.  Tongue midline.  No evidence of PTA or RPA.  No obvious source of dental infection Eyes:     Pupils: Pupils are equal, round, and reactive to light.  Neck:     Trachea: Trachea and phonation normal.      Comments: Mild swelling to posterior mandibular area.  Phonation clear.  Meningismus Cardiovascular:     Rate and Rhythm: Normal rate and regular rhythm.     Pulses: Normal pulses.          Radial pulses are 2+ on the right side and 2+ on the left side.     Heart sounds: Normal heart sounds.  Pulmonary:     Effort: Pulmonary effort is normal. No respiratory distress.     Breath sounds: Normal breath sounds and air entry.  Abdominal:     General: Bowel sounds are normal. There is no distension.     Palpations: Abdomen is soft.     Tenderness: There  is no abdominal tenderness.  Musculoskeletal:        General: Normal range of motion.     Cervical back: Full passive range of motion without pain, normal range of motion and neck supple.     Comments: Moves all 4 extremities without difficulty  Lymphadenopathy:     Cervical: No cervical adenopathy.  Skin:    General: Skin is warm and dry.     Capillary Refill: Capillary refill takes less than 2 seconds.     Comments: No obvious abscess.  Mild warmth and right-sided facial swelling.  Neurological:  Mental Status: He is alert.     ED Results / Procedures / Treatments   Labs (all labs ordered are listed, but only abnormal results are displayed) Labs Reviewed  BASIC METABOLIC PANEL - Abnormal; Notable for the following components:      Result Value   Glucose, Bld 109 (*)    All other components within normal limits  CBC WITH DIFFERENTIAL/PLATELET    EKG None  Radiology CT Maxillofacial W Contrast  Result Date: 12/16/2020 CLINICAL DATA:  Right-sided facial swelling, external ear drainage EXAM: CT MAXILLOFACIAL WITH CONTRAST TECHNIQUE: Multidetector CT imaging of the maxillofacial structures was performed with intravenous contrast. Multiplanar CT image reconstructions were also generated. CONTRAST:  80mL OMNIPAQUE IOHEXOL 300 MG/ML  SOLN COMPARISON:  None. FINDINGS: Osseous: Temporomandibular joints are unremarkable. Degenerative changes of visualized upper cervical spine. Periapical lucency about remaining left mandibular teeth. Orbits: No significant abnormality. Sinuses: Mild mucosal thickening. Lobular mucosal thickening within the right maxillary sinus alveolar recess may be odontogenic given periapical lucencies about remaining maxillary teeth. Soft tissues: Right auriculectomy and periauricular soft tissue swelling extending into the or face and suprahyoid neck laterally. Right parotid does not definitely appear to be involved. No soft tissue abscess. Of note, right middle ear  and mastoid air cells are aerated. There is nonspecific mild soft tissue thickening along the external auditory canal. Limited intracranial: No abnormal enhancement. IMPRESSION: Right auricular and periauricular inflammatory changes extending into the lower face without evidence of abscess. Mastoid air cells and middle ear are aerated. Nonspecific mild soft tissue thickening along the external auditory canal. Electronically Signed   By: Guadlupe Spanish M.D.   On: 12/16/2020 08:15    Procedures Procedures   Medications Ordered in ED Medications  Ampicillin-Sulbactam (UNASYN) 3 g in sodium chloride 0.9 % 100 mL IVPB (0 g Intravenous Stopped 12/16/20 0858)  iohexol (OMNIPAQUE) 300 MG/ML solution 75 mL (75 mLs Intravenous Contrast Given 12/16/20 0750)  ciprofloxacin-dexamethasone (CIPRODEX) 0.3-0.1 % OTIC (EAR) suspension 4 drop (4 drops Right EAR Given 12/16/20 0626)    ED Course  I have reviewed the triage vital signs and the nursing notes.  Pertinent labs & imaging results that were available during my care of the patient were reviewed by me and considered in my medical decision making (see chart for details).  50 year old here for evaluation of right-sided facial swelling.  He is afebrile, nonseptic, non-ill-appearing.  Apparently has had ongoing pruritus to bilateral ears for some time.  Noted yesterday his right ear was draining from the antihelix area.  Woke up this morning with moderate right-sided facial swelling which goes from his mastoid area, right ear, right zygomatic area, posterior mandible and just into the submandibular area.  He has no obvious dental infection on exam.  He has no systemic symptoms.  He denies any recent traumatic injuries. He does have some obvious drainage to his antihelix however no obvious abscess to drain.  He has some mild canal edema on right however no obvious drainage from his internal ear.  His TM is mildly erythematous however no bulging or fluid line.  He has  no neck stiffness or neck rigidity>> low suspicion for meningitis.  He has no stridor or acute respiratory distress>> Low suspicion for airway involvement.  No associated HA, CN deficet>> low suspicion for acute intracranial complications such as abscess, dural thrombosis. We will plan on labs, imaging, ABX. No erythema or palpable abscess, focal swelling over mastoid.  Labs and imaging personally reviewed and interpreted:  CBC  without leukocytosis BMP with glucose at 109, no additional lecture light, renal or liver abnormality CT max face W right-sided facial cellulitis.  No evidence of mastoiditis or drainable abscess. Low suspicion for malignant otitis externa, bony infection.  Patient reassessed. Discussed labs and imaging.  Discussed dose of IV antibiotics here in ED, p.o. antibiotics at home, ciprodex drops, ear wick and close outpatient PCP/ENT follow-up in 2 days for recheck.  Patient is agreeable for this.  He appears comfortable in the room.  I discussed strict return precautions.  Patient agreeable.  He will return for any new or worsening symptoms.  Will do p.o. antibiotics at home that cover for facial cellulitis as well as possible ear etiology.  The patient has been appropriately medically screened and/or stabilized in the ED. I have low suspicion for any other emergent medical condition which would require further screening, evaluation or treatment in the ED or require inpatient management.  Patient is hemodynamically stable and in no acute distress.  Patient able to ambulate in department prior to ED.  Evaluation does not show acute pathology that would require ongoing or additional emergent interventions while in the emergency department or further inpatient treatment.  I have discussed the diagnosis with the patient and answered all questions.  Pain is been managed while in the emergency department and patient has no further complaints prior to discharge.  Patient is comfortable with  plan discussed in room and is stable for discharge at this time.  I have discussed strict return precautions for returning to the emergency department.  Patient was encouraged to follow-up with PCP/specialist refer to at discharge.   MDM Rules/Calculators/A&P                           Final Clinical Impression(s) / ED Diagnoses Final diagnoses:  Facial cellulitis  Acute otitis externa of right ear, unspecified type    Rx / DC Orders ED Discharge Orders         Ordered    doxycycline (VIBRAMYCIN) 100 MG capsule  2 times daily        12/16/20 0915    oxyCODONE (ROXICODONE) 5 MG immediate release tablet  Every 6 hours PRN        12/16/20 0915    naproxen (NAPROSYN) 500 MG tablet  2 times daily        12/16/20 0915    bacitracin ointment  2 times daily        12/16/20 0916           Fairley Copher A, PA-C 12/16/20 8088    Bethann Berkshire, MD 12/18/20 [phone]

## 2020-12-16 NOTE — Discharge Instructions (Addendum)
It was our pleasure taking care of you here in the emergency department  You have an infection superficial layers of your skin.  You also treating you for possible ear infection.  Use the drops given to you here in the emergency department, 4 drops daily into your right ear.  Prescription called bacitracin ointment.  Apply this to the outer portion of your ear with the drainage is present  Take the antibiotic, doxycycline.  You may start this this evening.  I recommend taking with food as this medication may make people nauseous.  I have written you a short course of pain medicine called Roxicodone.  Do not drive or operate heavy machinery while taking this medication.  This medication is an opiate may become addictive.  Only take as needed.  I have also written you for a medication called naproxen which may help with your inflammation.  Follow-up with your primary care provider on Friday for reevaluation  You may also call the ear nose and throat provider listed on your discharge instructions for an appointment  Return to the emergency department if you notice any significant fever, worsening swelling, redness, severe headache, blurred vision or numbness to face

## 2020-12-16 NOTE — ED Triage Notes (Signed)
Pt reports right ear drainage x 3 months. Sts it has not bothered him until right side of his face began swelling this morning. Denies headache, throat discomfort or fever.

## 2022-02-22 ENCOUNTER — Emergency Department (HOSPITAL_COMMUNITY)
Admission: EM | Admit: 2022-02-22 | Discharge: 2022-02-22 | Disposition: A | Discharge status: Home / Self Care | Payer: 59 | Attending: Emergency Medicine | Admitting: Emergency Medicine

## 2022-02-22 ENCOUNTER — Encounter (HOSPITAL_COMMUNITY): Payer: Self-pay

## 2022-02-22 ENCOUNTER — Emergency Department (HOSPITAL_COMMUNITY): Payer: 59

## 2022-02-22 DIAGNOSIS — S46912A Strain of unspecified muscle, fascia and tendon at shoulder and upper arm level, left arm, initial encounter: Secondary | ICD-10-CM | POA: Insufficient documentation

## 2022-02-22 DIAGNOSIS — Z79899 Other long term (current) drug therapy: Secondary | ICD-10-CM | POA: Diagnosis not present

## 2022-02-22 DIAGNOSIS — W01198A Fall on same level from slipping, tripping and stumbling with subsequent striking against other object, initial encounter: Secondary | ICD-10-CM | POA: Insufficient documentation

## 2022-02-22 DIAGNOSIS — S4992XA Unspecified injury of left shoulder and upper arm, initial encounter: Secondary | ICD-10-CM | POA: Diagnosis present

## 2022-02-22 DIAGNOSIS — L299 Pruritus, unspecified: Secondary | ICD-10-CM

## 2022-02-22 MED ORDER — DICLOFENAC SODIUM 75 MG PO TBEC: 75.0000 mg | DELAYED_RELEASE_TABLET | Freq: Two times a day (BID) | ORAL | 0 refills | Status: AC

## 2022-02-22 MED ORDER — CETIRIZINE HCL 10 MG PO TABS: 10.0000 mg | ORAL_TABLET | Freq: Every day | ORAL | 1 refills | Status: AC

## 2022-02-22 NOTE — Discharge Instructions (Addendum)
Return if any problems.  See the Orthopaedist if pain persist

## 2022-02-22 NOTE — ED Provider Notes (Signed)
[facility]-EMERGENCY DEPT Provider Note   CSN: [id number] Arrival date & time: 02/22/22  1343     History  Chief Complaint  Patient presents with   Shoulder Injury    left   Ear Drainage    Daniel Rivera is a 51 y.o. male.  Pt reports he fell 3 days ago.  Pt reports pain in his left shoulder and clavicle area.  Pt reports pain with movement.  Pt fell 3 days ago and hit his shoulder.   The history is provided by the patient. No language interpreter was used.  Shoulder Injury This is a new problem. The problem occurs constantly. The problem has not changed since onset.Nothing aggravates the symptoms. He has tried nothing for the symptoms. The treatment provided no relief.  Ear Drainage       Home Medications Prior to Admission medications   Medication Sig Start Date End Date Taking? Authorizing Provider  amoxicillin (AMOXIL) 500 MG capsule Take 1 capsule (500 mg total) by mouth 3 (three) times daily. Patient not taking: Reported on 03/14/2018 01/24/14   Arthor Captain, PA-C  atorvastatin (LIPITOR) 40 MG tablet Take 40 mg by mouth daily.    [provider]  bacitracin ointment Apply 1 application topically 2 (two) times daily. 12/16/20   Henderly, Britni A, PA-C  doxycycline (VIBRAMYCIN) 100 MG capsule Take 1 capsule (100 mg total) by mouth 2 (two) times daily. 12/16/20   Henderly, Britni A, PA-C  lisinopril (PRINIVIL,ZESTRIL) 10 MG tablet Take 1 tablet (10 mg total) by mouth daily. 03/14/18 04/13/18  Cristina Gong, PA-C  lisinopril (ZESTRIL) 20 MG tablet Take 20 mg by mouth daily.    [provider]  naproxen (NAPROSYN) 500 MG tablet Take 1 tablet (500 mg total) by mouth 2 (two) times daily. 12/16/20   Henderly, Britni A, PA-C  oxyCODONE-acetaminophen (PERCOCET) 5-325 MG per tablet Take 1-2 tablets by mouth every 4 (four) hours as needed. Patient not taking: Reported on 03/14/2018 01/24/14   Arthor Captain, PA-C      Allergies    Patient  has no allergy information on record.    Review of Systems   Review of Systems  Musculoskeletal:  Positive for arthralgias. Negative for joint swelling.  All other systems reviewed and are negative.   Physical Exam Updated Vital Signs BP (!) 173/98   Pulse 74   Temp 98.2 F (36.8 C) (Oral)   Resp 18   SpO2 100%  Physical Exam Vitals reviewed.  Constitutional:      Appearance: Normal appearance.  HENT:     Head: Normocephalic.  Cardiovascular:     Rate and Rhythm: Normal rate.  Pulmonary:     Effort: Pulmonary effort is normal.  Musculoskeletal:        General: No swelling or tenderness. Normal range of motion.     Comments: Tender distal clavicle,  pain with movement,  nv and ns intact   Skin:    General: Skin is warm.  Neurological:     General: No focal deficit present.     Mental Status: He is alert.  Psychiatric:        Mood and Affect: Mood normal.     ED Results / Procedures / Treatments   Labs (all labs ordered are listed, but only abnormal results are displayed) Labs Reviewed - No data to display  EKG None  Radiology DG Shoulder Left  Result Date: 02/22/2022 CLINICAL DATA:  Left shoulder pain after tripping over grandson  and falling on left side of shoulder 3 days ago. EXAM: LEFT SHOULDER - 2+ VIEW; LEFT CLAVICLE - 2+ VIEWS COMPARISON:  None Available. FINDINGS: Left shoulder: Mild glenohumeral joint space narrowing. Mild inferior, posterior, and anterior glenohumeral degenerative osteophytosis. Mild acromioclavicular joint space narrowing and peripheral osteophytosis. Left clavicle: No acute fracture is seen within the clavicle. Normal alignment of the acromioclavicular joint. Likely normal alignment of the left sternoclavicular joint. IMPRESSION: 1. No acute fracture. 2. Mild glenohumeral and mild acromioclavicular osteoarthritis. Electronically Signed   By: Neita Garnet M.D.   On: 02/22/2022 15:20   DG Clavicle Left  Result Date: 02/22/2022 CLINICAL  DATA:  Left shoulder pain after tripping over grandson and falling on left side of shoulder 3 days ago. EXAM: LEFT SHOULDER - 2+ VIEW; LEFT CLAVICLE - 2+ VIEWS COMPARISON:  None Available. FINDINGS: Left shoulder: Mild glenohumeral joint space narrowing. Mild inferior, posterior, and anterior glenohumeral degenerative osteophytosis. Mild acromioclavicular joint space narrowing and peripheral osteophytosis. Left clavicle: No acute fracture is seen within the clavicle. Normal alignment of the acromioclavicular joint. Likely normal alignment of the left sternoclavicular joint. IMPRESSION: 1. No acute fracture. 2. Mild glenohumeral and mild acromioclavicular osteoarthritis. Electronically Signed   By: Neita Garnet M.D.   On: 02/22/2022 15:20    Procedures Procedures    Medications Ordered in ED Medications - No data to display  ED Course/ Medical Decision Making/ A&P                           Medical Decision Making Pt fell 3 days ago and injured his shoulder   Amount and/or Complexity of Data Reviewed Radiology: ordered and independent interpretation performed. Decision-making details documented in ED Course.    Details: Xray of left shoulder and left clavicle,   no fracture  Risk Prescription drug management.           Final Clinical Impression(s) / ED Diagnoses Final diagnoses:  Muscle strain, shoulder region, left, initial encounter    Rx / DC Orders ED Discharge Orders          Ordered    diclofenac (VOLTAREN) 75 MG EC tablet  2 times daily        02/22/22 1546          An After Visit Summary was printed and given to the patient.     Osie Cheeks 02/22/22 1548    Gerhard Munch, MD 02/22/22 [phone]

## 2022-02-22 NOTE — ED Triage Notes (Addendum)
Pt reports left shoulder pain after tripping over his grandson and fell on the left side of shoulder x3 days ago.   Pt c/o left ear drainage and itching all over arms.    A/Ox4 Ambulatory in triage.

## 2022-07-26 ENCOUNTER — Other Ambulatory Visit: Payer: Self-pay

## 2022-07-26 ENCOUNTER — Emergency Department (HOSPITAL_COMMUNITY)
Admission: EM | Admit: 2022-07-26 | Discharge: 2022-07-26 | Discharge status: Against Medical Advice | Payer: Self-pay | Attending: Emergency Medicine | Admitting: Emergency Medicine

## 2022-07-26 DIAGNOSIS — M25512 Pain in left shoulder: Secondary | ICD-10-CM | POA: Insufficient documentation

## 2022-07-26 DIAGNOSIS — Z5321 Procedure and treatment not carried out due to patient leaving prior to being seen by health care provider: Secondary | ICD-10-CM | POA: Insufficient documentation

## 2022-07-26 DIAGNOSIS — Z8616 Personal history of COVID-19: Secondary | ICD-10-CM | POA: Insufficient documentation

## 2022-07-26 DIAGNOSIS — R070 Pain in throat: Secondary | ICD-10-CM | POA: Insufficient documentation

## 2022-07-26 DIAGNOSIS — H9202 Otalgia, left ear: Secondary | ICD-10-CM | POA: Insufficient documentation

## 2022-07-26 DIAGNOSIS — Z1152 Encounter for screening for COVID-19: Secondary | ICD-10-CM | POA: Insufficient documentation

## 2022-07-26 LAB — RESP PANEL BY RT-PCR (RSV, FLU A&B, COVID)  RVPGX2
Influenza A by PCR: NEGATIVE
Influenza B by PCR: NEGATIVE
Resp Syncytial Virus by PCR: NEGATIVE
SARS Coronavirus 2 by RT PCR: NEGATIVE

## 2022-07-26 NOTE — ED Triage Notes (Signed)
Presents with sore throat x 1 week, rt ear pain, left clavicle/shoulder pain (chronic) states he is dehydrated, lips peeling.

## 2022-07-26 NOTE — ED Provider Triage Note (Signed)
Emergency Medicine Provider Triage Evaluation Note  Daniel Rivera , a 51 y.o. male  was evaluated in triage.  Pt complains of throat, right ear pain, chronic left shoulder pain, cough, and dehydration.  He states has had the symptoms for approximately 1 week and it is painful to swallow.  He reports feeling hot and cold, but no confirmed fevers.  Denies chest pain, shortness of breath, nausea, vomiting, diarrhea.  Review of Systems  Positive: As above Negative: As above  Physical Exam  BP (!) 167/119 (BP Location: Right Arm)   Pulse 87   Temp 98.6 F (37 C) (Oral)   Resp 20   Ht 6' (1.829 m)   Wt 100.7 kg   SpO2 100%   BMI 30.12 kg/m  Gen:   Awake, no distress   Resp:  Normal effort  MSK:   Moves extremities without difficulty  Other:  Posterior oropharynx erythematous  Medical Decision Making  Medically screening exam initiated at 5:01 PM.  Appropriate orders placed.  Belford Pascucci was informed that the remainder of the evaluation will be completed by another provider, this initial triage assessment does not replace that evaluation, and the importance of remaining in the ED until their evaluation is complete.     Daniel Rivera, [place] 07/26/22 [phone]

## 2022-11-29 IMAGING — CT CT MAXILLOFACIAL W/ CM
3 series · 16 of 47 positions shown, 19 images · IV contrast (omnipaque)
Comparison: None.

CLINICAL DATA: Right-sided facial swelling, external ear drainage

EXAM:
CT MAXILLOFACIAL WITH CONTRAST
TECHNIQUE: Multidetector CT imaging of the maxillofacial structures was
performed with intravenous contrast. Multiplanar CT image
reconstructions were also generated.
CONTRAST:  75mL OMNIPAQUE IOHEXOL 300 MG/ML  SOLN

[Series 3: max soft · axial · 0.40mm/px · z∈[-203,-43]mm · 10 of 94 slices shown, 13 images]
[im 7/94  brain]
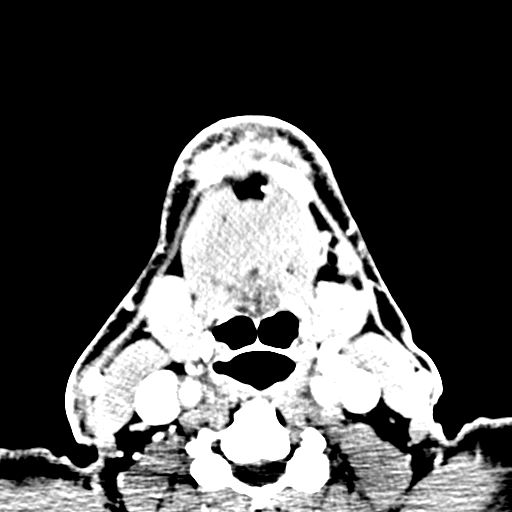
[im 7/94  bone]
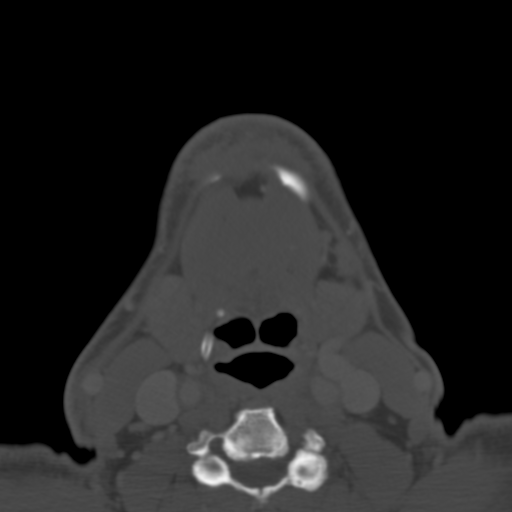
[im 17/94  bone]
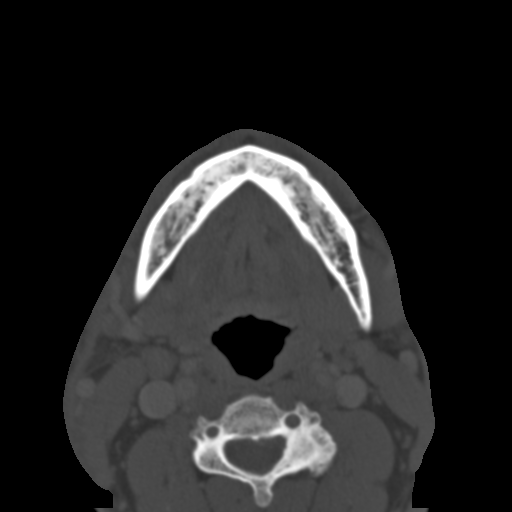
[im 26/94  bone]
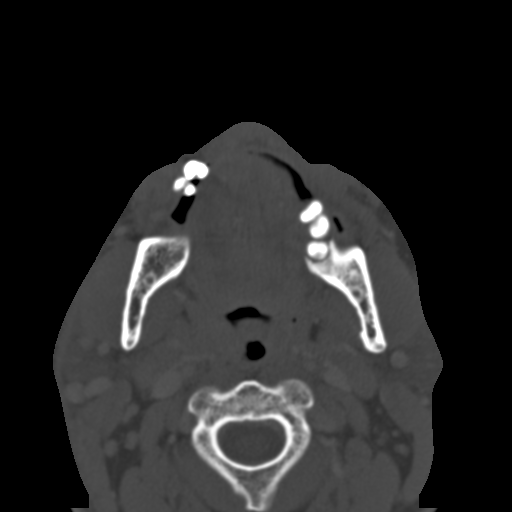
[im 33/94  bone]
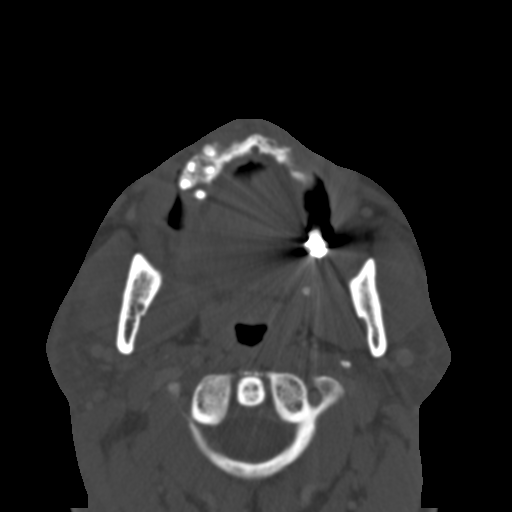
[im 42/94  brain]
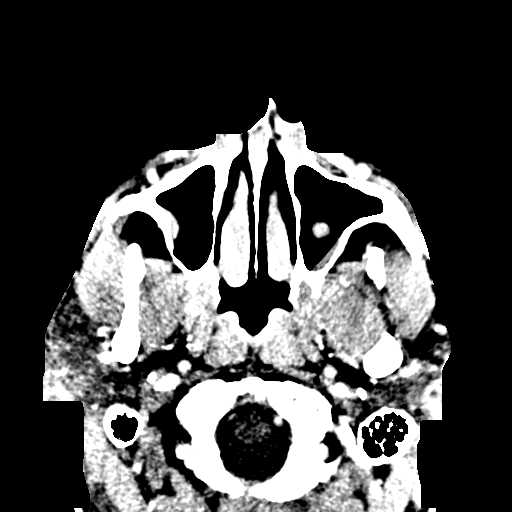
[im 42/94  bone]
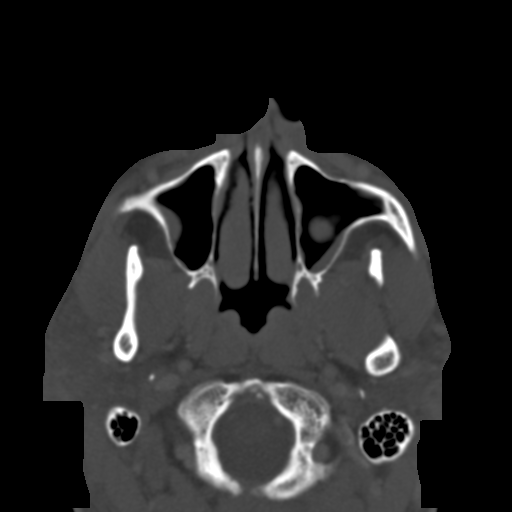
[im 52/94  bone]
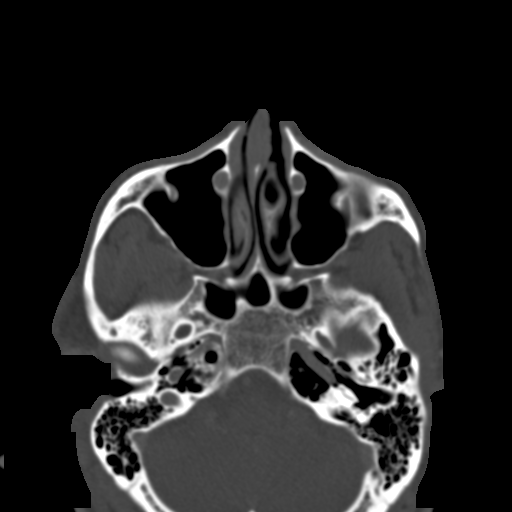
[im 61/94  bone]
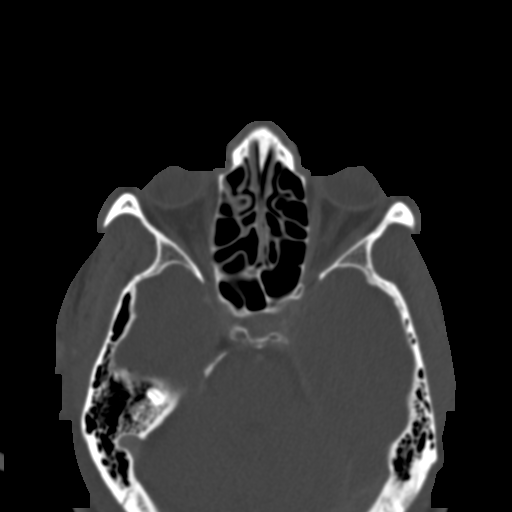
[im 71/94  bone]
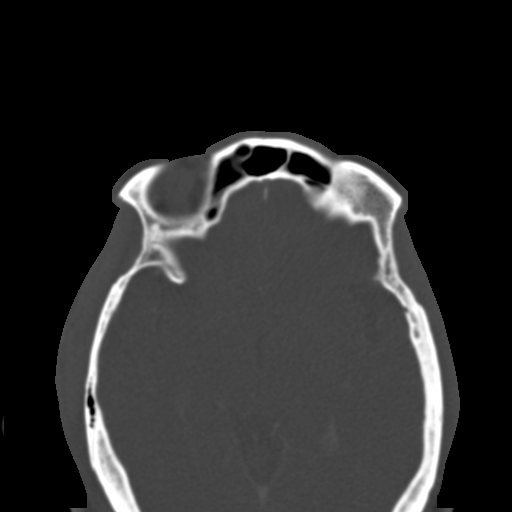
[im 77/94  brain]
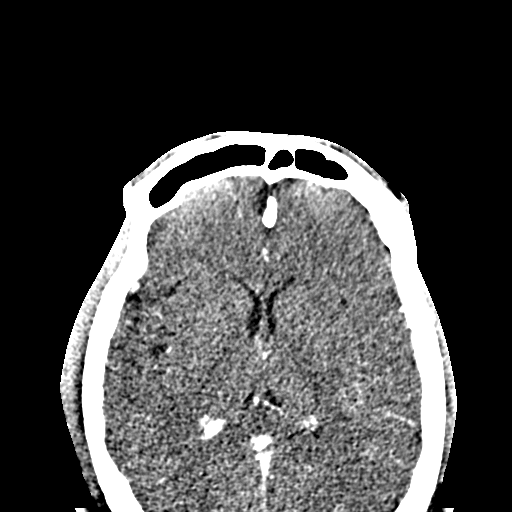
[im 77/94  bone]
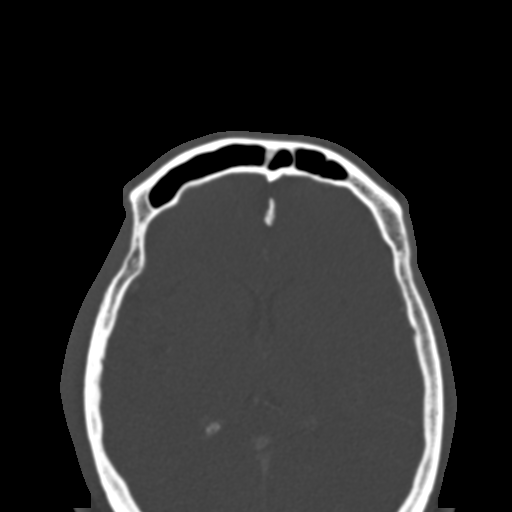
[im 87/94  bone]
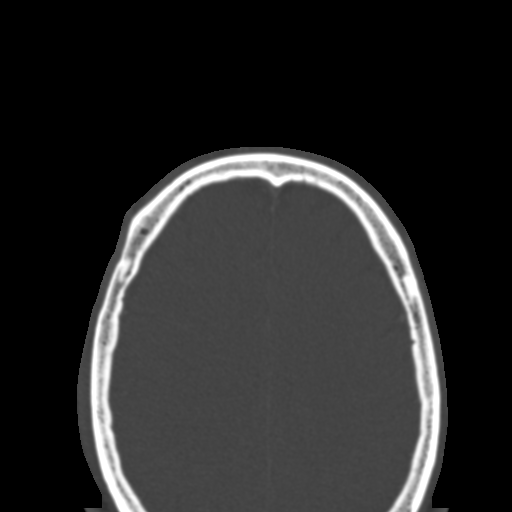

[Series 5: coronal soft · coronal · 0.36mm/px · 3 of 87 slices shown]
[im 29/87  bone]
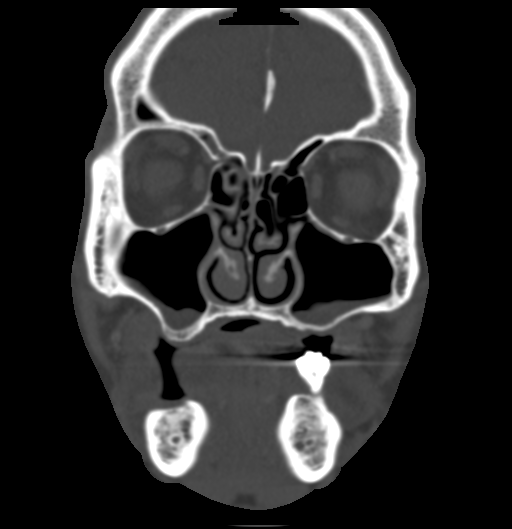
[im 39/87  bone]
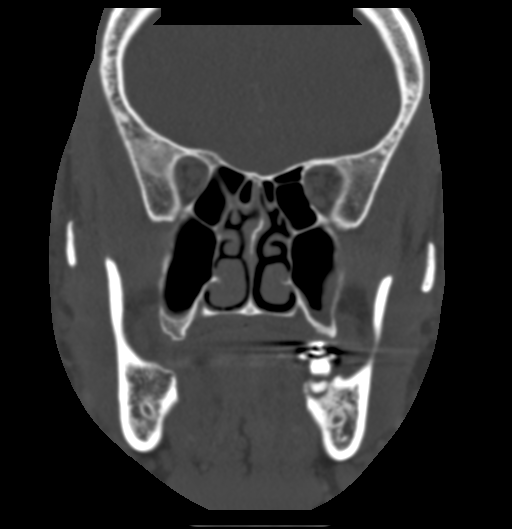
[im 48/87  bone]
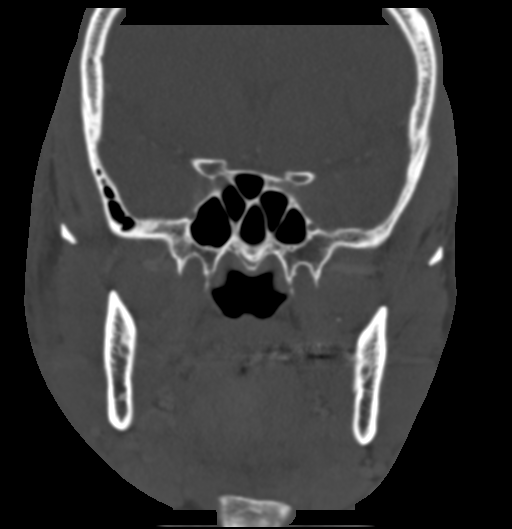

[Series 6: sagittal soft · sagittal · 0.37mm/px · 3 of 94 slices shown]
[im 32/94  bone]
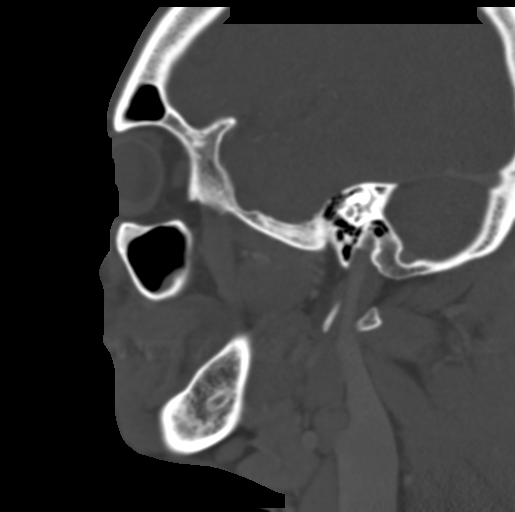
[im 47/94  bone]
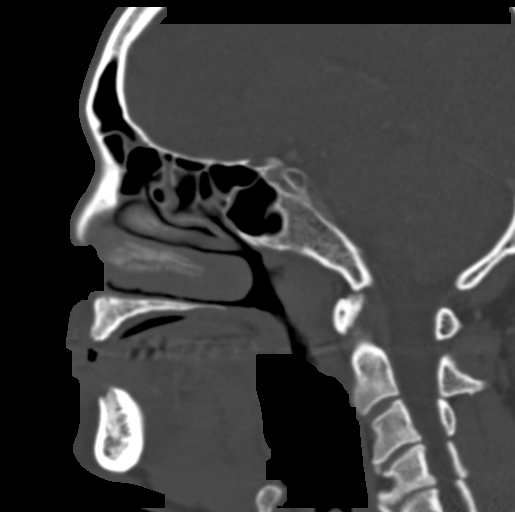
[im 63/94  bone]
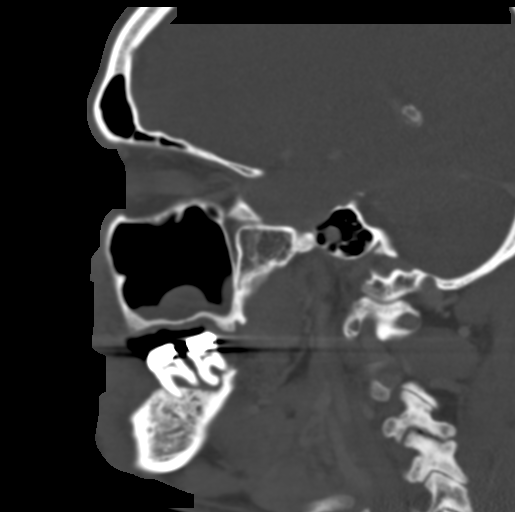

[16 of 47 positions shown; findings below may reference images not displayed]

FINDINGS: Osseous: Temporomandibular joints are unremarkable. Degenerative
changes of visualized upper cervical spine. Periapical lucency about
remaining left mandibular teeth.

Orbits: No significant abnormality.

Sinuses: Mild mucosal thickening. Lobular mucosal thickening within
the right maxillary sinus alveolar recess may be odontogenic given
periapical lucencies about remaining maxillary teeth.

Soft tissues: Right auriculectomy and periauricular soft tissue
swelling extending into the or face and suprahyoid neck laterally.
Right parotid does not definitely appear to be involved. No soft
tissue abscess. Of note, right middle ear and mastoid air cells are
aerated. There is nonspecific mild soft tissue thickening along the
external auditory canal.

Limited intracranial: No abnormal enhancement.
IMPRESSION: Right auricular and periauricular inflammatory changes extending
into the lower face without evidence of abscess. Mastoid air cells
and middle ear are aerated. Nonspecific mild soft tissue thickening
along the external auditory canal.

## 2023-09-26 ENCOUNTER — Other Ambulatory Visit: Payer: Self-pay

## 2023-09-26 ENCOUNTER — Encounter (HOSPITAL_COMMUNITY): Payer: Self-pay

## 2023-09-26 ENCOUNTER — Emergency Department (HOSPITAL_COMMUNITY)
Admission: EM | Admit: 2023-09-26 | Discharge: 2023-09-26 | Disposition: A | Discharge status: Home / Self Care | Payer: 59 | Attending: Emergency Medicine | Admitting: Emergency Medicine

## 2023-09-26 DIAGNOSIS — I10 Essential (primary) hypertension: Secondary | ICD-10-CM | POA: Diagnosis present

## 2023-09-26 DIAGNOSIS — Z79899 Other long term (current) drug therapy: Secondary | ICD-10-CM | POA: Diagnosis not present

## 2023-09-26 DIAGNOSIS — M7918 Myalgia, other site: Secondary | ICD-10-CM | POA: Insufficient documentation

## 2023-09-26 DIAGNOSIS — F17200 Nicotine dependence, unspecified, uncomplicated: Secondary | ICD-10-CM | POA: Diagnosis not present

## 2023-09-26 DIAGNOSIS — R300 Dysuria: Secondary | ICD-10-CM | POA: Diagnosis not present

## 2023-09-26 DIAGNOSIS — R52 Pain, unspecified: Secondary | ICD-10-CM

## 2023-09-26 LAB — COMPREHENSIVE METABOLIC PANEL
ALT: 33 U/L (ref 0–44)
AST: 26 U/L (ref 15–41)
Albumin: 4.4 g/dL (ref 3.5–5.0)
Alkaline Phosphatase: 107 U/L (ref 38–126)
Anion gap: 10 (ref 5–15)
BUN: 15 mg/dL (ref 6–20)
CO2: 23 mmol/L (ref 22–32)
Calcium: 9.4 mg/dL (ref 8.9–10.3)
Chloride: 109 mmol/L (ref 98–111)
Creatinine, Ser: 0.62 mg/dL (ref 0.61–1.24)
GFR, Estimated: >60 mL/min (ref >60)
Glucose, Bld: 127 mg/dL — ABNORMAL HIGH (ref 70–99)
Potassium: 3.6 mmol/L (ref 3.5–5.1)
Sodium: 142 mmol/L (ref 135–145)
Total Bilirubin: 0.5 mg/dL (ref 0.0–1.2)
Total Protein: 7.4 g/dL (ref 6.5–8.1)

## 2023-09-26 LAB — CBC WITH DIFFERENTIAL/PLATELET
Abs Immature Granulocytes: 0.05 10*3/uL (ref 0.00–0.07)
Basophils Absolute: 0 10*3/uL (ref 0.0–0.1)
Basophils Relative: 0 %
Eosinophils Absolute: 0.8 10*3/uL — ABNORMAL HIGH (ref 0.0–0.5)
Eosinophils Relative: 10 %
HCT: 43.8 % (ref 39.0–52.0)
Hemoglobin: 14 g/dL (ref 13.0–17.0)
Immature Granulocytes: 1 %
Lymphocytes Relative: 30 %
Lymphs Abs: 2.2 10*3/uL (ref 0.7–4.0)
MCH: 31.5 pg (ref 26.0–34.0)
MCHC: 32 g/dL (ref 30.0–36.0)
MCV: 98.4 fL (ref 80.0–100.0)
Monocytes Absolute: 0.6 10*3/uL (ref 0.1–1.0)
Monocytes Relative: 8 %
Neutro Abs: 3.7 10*3/uL (ref 1.7–7.7)
Neutrophils Relative %: 51 %
Platelets: 195 10*3/uL (ref 150–400)
RBC: 4.45 MIL/uL (ref 4.22–5.81)
RDW: 12.1 % (ref 11.5–15.5)
WBC: 7.3 10*3/uL (ref 4.0–10.5)
nRBC: 0 % (ref 0.0–0.2)

## 2023-09-26 LAB — RESP PANEL BY RT-PCR (RSV, FLU A&B, COVID)  RVPGX2
Influenza A by PCR: NEGATIVE
Influenza B by PCR: NEGATIVE
Resp Syncytial Virus by PCR: NEGATIVE
SARS Coronavirus 2 by RT PCR: NEGATIVE

## 2023-09-26 LAB — URINALYSIS, W/ REFLEX TO CULTURE (INFECTION SUSPECTED)
Bacteria, UA: NONE SEEN
Bilirubin Urine: NEGATIVE
Glucose, UA: NEGATIVE mg/dL
Ketones, ur: NEGATIVE mg/dL
Leukocytes,Ua: NEGATIVE
Nitrite: NEGATIVE
Protein, ur: NEGATIVE mg/dL
Specific Gravity, Urine: 1.023 (ref 1.005–1.030)
pH: 5 (ref 5.0–8.0)

## 2023-09-26 LAB — CK: Total CK: 105 U/L (ref 49–397)

## 2023-09-26 LAB — TROPONIN I (HIGH SENSITIVITY): Troponin I (High Sensitivity): 4 ng/L (ref <18)

## 2023-09-26 MED ORDER — NITROFURANTOIN MONOHYD MACRO 100 MG PO CAPS: 100.0000 mg | ORAL_CAPSULE | Freq: Two times a day (BID) | ORAL | 0 refills | Status: AC

## 2023-09-26 MED ORDER — VALSARTAN 80 MG PO TABS: 80.0000 mg | ORAL_TABLET | Freq: Every day | ORAL | 0 refills | Status: AC

## 2023-09-26 NOTE — ED Provider Notes (Addendum)
[place] EMERGENCY DEPARTMENT AT [facility] Provider Note   CSN: [id number] Arrival date & time: 09/26/23  4403     History  Chief Complaint  Patient presents with   Generalized Body Aches    Daniel Rivera is a 53 y.o. male.  HPI    53 year old male comes in with chief complaint of generalized bodyaches, shortness of breath, urinary discomfort/burning at the end of urine stream, dark-colored urine.  Patient has history of hypertension.  He has not been taking his medications.  Patient states that he has been feeling sick for the last 3 to 4 days, but his shortness of breath has been off and on for the last month.  Patient denies any chest pain.  He has no current cough, URI-like symptoms.  Pt has no hx of PE, DVT and denies any exogenous hormone (testosterone / estrogen) use, long distance travels or surgery in the past 6 weeks, active cancer, recent immobilization.  Patient admits to smoking, alcohol use, but denies any substance use disorder.  Patient states that he was incarcerated, released from jail 4 days ago.  He just wants a general checkup.  Home Medications Prior to Admission medications   Medication Sig Start Date End Date Taking? Authorizing Provider  nitrofurantoin, macrocrystal-monohydrate, (MACROBID) 100 MG capsule Take 1 capsule (100 mg total) by mouth 2 (two) times daily. 09/26/23  Yes Derwood Kaplan, MD  valsartan (DIOVAN) 80 MG tablet Take 1 tablet (80 mg total) by mouth daily for 15 days. 09/26/23 10/11/23 Yes Derwood Kaplan, MD  atorvastatin (LIPITOR) 40 MG tablet Take 40 mg by mouth daily.    [provider]  bacitracin ointment Apply 1 application topically 2 (two) times daily. 12/16/20   Henderly, Britni A, PA-C  cetirizine (ZYRTEC ALLERGY) 10 MG tablet Take 1 tablet (10 mg total) by mouth daily. 02/22/22   Elson Areas, PA-C  diclofenac (VOLTAREN) 75 MG EC tablet Take 1 tablet (75 mg total) by mouth 2 (two) times daily. 02/22/22    Elson Areas, PA-C  doxycycline (VIBRAMYCIN) 100 MG capsule Take 1 capsule (100 mg total) by mouth 2 (two) times daily. 12/16/20   Henderly, Britni A, PA-C      Allergies    Patient has no known allergies.    Review of Systems   Review of Systems  All other systems reviewed and are negative.   Physical Exam Updated Vital Signs BP (!) 192/91 (BP Location: Right Arm)   Pulse 100   Temp 98.6 F (37 C) (Oral)   Resp 18   Ht 6' (1.829 m)   Wt 100.8 kg   SpO2 98%   BMI 30.14 kg/m  Physical Exam Vitals and nursing note reviewed.  Constitutional:      Appearance: He is well-developed.  HENT:     Head: Atraumatic.  Cardiovascular:     Rate and Rhythm: Normal rate.  Pulmonary:     Effort: Pulmonary effort is normal.  Musculoskeletal:     Cervical back: Neck supple.  Skin:    General: Skin is warm.  Neurological:     Mental Status: He is alert and oriented to person, place, and time.     ED Results / Procedures / Treatments   Labs (all labs ordered are listed, but only abnormal results are displayed) Labs Reviewed  COMPREHENSIVE METABOLIC PANEL - Abnormal; Notable for the following components:      Result Value   Glucose, Bld 127 (*)    All other  components within normal limits  CBC WITH DIFFERENTIAL/PLATELET - Abnormal; Notable for the following components:   Eosinophils Absolute 0.8 (*)    All other components within normal limits  URINALYSIS, W/ REFLEX TO CULTURE (INFECTION SUSPECTED) - Abnormal; Notable for the following components:   Hgb urine dipstick MODERATE (*)    All other components within normal limits  RESP PANEL BY RT-PCR (RSV, FLU A&B, COVID)  RVPGX2  CK  GC/CHLAMYDIA PROBE AMP ([place]) NOT AT [facility]  TROPONIN I (HIGH SENSITIVITY)  TROPONIN I (HIGH SENSITIVITY)    EKG None  Radiology No results found.  Procedures Procedures    Medications Ordered in ED Medications - No data to display  ED Course/ Medical Decision Making/ A&P                                  Medical Decision Making Amount and/or Complexity of Data Reviewed Labs: ordered.  Risk Prescription drug management.   53 year old male comes in with chief complaint of bodyaches, shortness of breath, urinary discomfort and darkening of the urine.  He does not have any URI-like symptoms.  He is also noted to be hypertensive, admits to not taking his medications.  Differential diagnosis includes rhabdomyolysis, electrolyte disturbance, severe dehydration, viral syndrome including influenza, uncontrolled hypertension.  Plan is to get basic labs including UA, CK, troponin.  12:35 PM Still reports dysuria- UA reassuring, but not sensitive enough to r/o UTI. Will put him on macrobid.   The patient appears reasonably screened and/or stabilized for discharge and I doubt any other medical condition or other [facility] requiring further screening, evaluation, or treatment in the ED at this time prior to discharge.   Results from the ER workup discussed with the patient face to face and all questions answered to the best of my ability. The patient is safe for discharge with strict return precautions.  Final Clinical Impression(s) / ED Diagnoses Final diagnoses:  Uncontrolled hypertension  Generalized body aches  Dysuria    Rx / DC Orders ED Discharge Orders          Ordered    Referral to [facility] Care Management       Comments: Pharmacy Medication Management for Uncontrolled hypertension in the ED   09/26/23 1029    valsartan (DIOVAN) 80 MG tablet  Daily        09/26/23 1029    nitrofurantoin, macrocrystal-monohydrate, (MACROBID) 100 MG capsule  2 times daily        09/26/23 1215              Derwood Kaplan, MD 09/26/23 1215    Derwood Kaplan, MD 09/26/23 1235

## 2023-09-26 NOTE — ED Triage Notes (Signed)
Pt arrived reporting bodyaches all over. States pain everywhere. Endorses feeling like this for a long time. States was to get a "check up". Reports some urinary frequency and burning. Denies hematuria. No other symptoms reported.

## 2023-09-26 NOTE — Discharge Instructions (Addendum)
Thank you for the opportunity to take care of you in our Emergency Department.  You were seen in the ER for body aches, shortness of breath, urinary discomfort. Take the antibiotics.  You have been diagnosed with high blood pressure, also known as hypertension. This means that the force of blood against the walls of your blood vessels called is too strong. It also means that your heart has to work harder to move the blood. High blood pressure usually has no symptoms, but over time, it can cause serious health problems such as Heart attack and heart failure Stroke Kidney disease and failure Vision loss With the help from your healthcare provider and some important life style changes, you can manage your blood pressure and protect your health. Please read the instructions provided on hypertension, how to manage it and how to check your blood pressure. Additionally, use the blood pressure log provided to record your blood pressures. Take the blood pressure log with you to your primary care doctor so that they can adjust your blood pressure medications if needed. Please read the instructions on follow-up appointment. Return to the ER or Call 911 right away if you have any of these symptoms: Chest pain or shortness of breath Severe headache Weakness, tingling, or numbness of your face, arms, or legs (especially on 1 side of the body) Sudden change in vision Confusion, trouble speaking, or trouble understanding speech

## 2023-09-26 NOTE — ED Provider Triage Note (Signed)
Emergency Medicine Provider Triage Evaluation Note  Daniel Rivera , a 53 y.o. male  was evaluated in triage.  Pt complains of body aches, pain at the end of the urine, shob, dark urine.  Review of Systems  Positive: above Negative: Bloody urine, stool, discharge, chest pain, headache  Physical Exam  BP (!) 192/91 (BP Location: Right Arm)   Pulse 100   Temp 98.6 F (37 C) (Oral)   Resp 18   Ht 6' (1.829 m)   Wt 100.8 kg   SpO2 98%   BMI 30.14 kg/m  Gen:   Awake, no distress   Resp:  Normal effort  MSK:   Moves extremities without difficulty  Other:    Medical Decision Making  Medically screening exam initiated at 8:48 AM.  Appropriate orders placed.  Daniel Rivera was informed that the remainder of the evaluation will be completed by another provider, this initial triage assessment does not replace that evaluation, and the importance of remaining in the ED until their evaluation is complete.  Will order appropriate labs.    Derwood Kaplan, MD 09/26/23 [phone]

## 2023-09-27 LAB — GC/CHLAMYDIA PROBE AMP (~~LOC~~) NOT AT ARMC
Chlamydia: NEGATIVE
Comment: NEGATIVE
Comment: NORMAL
Neisseria Gonorrhea: NEGATIVE

## 2023-09-28 ENCOUNTER — Telehealth: Payer: Self-pay

## 2023-09-28 NOTE — Progress Notes (Signed)
Care Guide Pharmacy Note  09/28/2023 Name: Daniel Rivera MRN: [id number] DOB: 06-Sep-1970  Referred By: Patient, No Pcp Per Reason for referral: Care Coordination (Outreach to schedule with Pharm d )   [name] is a 53 y.o. year old male who is a primary care patient of Patient, No Pcp Per.  [name] was referred to the pharmacist for assistance related to: HTN  Successful contact was made with the patient to discuss pharmacy services including being ready for the pharmacist to call at least 5 minutes before the scheduled appointment time and to have medication bottles and any blood pressure readings ready for review. The patient agreed to meet with the pharmacist via telephone visit on (date/time).09/30/2023  [name] , RMA     [place]  [facility], [facility] Guide  Direct Dial: [phone]  Website: [email or web address]

## 2023-09-30 ENCOUNTER — Other Ambulatory Visit: Payer: Self-pay

## 2023-09-30 NOTE — Progress Notes (Unsigned)
   09/30/2023  Patient ID: Daniel Rivera, male   DOB: Jun 12, 1971, 53 y.o.   MRN: [id number]  Attempted to contact patient for scheduled appointment for ED HTN f/u. Left HIPAA compliant message for patient to return my call at their convenience.   Sherrill Raring, PharmD Clinical Pharmacist [phone]

## 2023-10-12 ENCOUNTER — Telehealth: Payer: Self-pay

## 2023-10-12 NOTE — Progress Notes (Signed)
 Complex Care Management Care Guide Note  10/12/2023 Name: Daniel Rivera MRN: [id number] DOB: 1971-02-28  Daniel Rivera is a 53 y.o. year old male who is a primary care patient of Patient, No Pcp Per and is actively engaged with the care management team. I reached out to [name] by phone today to assist with re-scheduling  with the Pharmacist.  Follow up plan: Unsuccessful telephone outreach attempt made. A HIPAA compliant phone message was left for the patient providing contact information and requesting a return call.  [name] , RMA     [facility]  [facility], [facility] Guide  Direct Dial: [phone]  Website: [email or web address]

## 2023-10-19 NOTE — Progress Notes (Signed)
 Complex Care Management Care Guide Note  10/19/2023 Name: Daniel Rivera MRN: [id number] DOB: 01/07/1971  [name] is a 53 y.o. year old male who is a primary care patient of Patient, No Pcp Per and is actively engaged with the care management team. I reached out to [name] by phone today to assist with re-scheduling  with the Pharmacist.  Follow up plan: Unsuccessful telephone outreach attempt made.   [name] , RMA     [facility]  [facility], [facility] Guide  Direct Dial: [phone]  Website: [email or web address]

## 2023-10-21 ENCOUNTER — Other Ambulatory Visit: Payer: Self-pay

## 2023-10-26 ENCOUNTER — Telehealth: Payer: Self-pay

## 2023-10-26 NOTE — Progress Notes (Signed)
   10/26/2023  Patient ID: Daniel Rivera, male   DOB: 1970-11-12, 53 y.o.   MRN: [id number]  Attempted to reach patient again to follow up on ED visit. Someone answered the phone and call disconnected before I could finish introducing myself. Attempted to call back right after and was sent to voicemail but unable to leave a message.  Sherrill Raring, PharmD Clinical Pharmacist [phone]
